# Patient Record
Sex: Male | Born: 1965 | Race: White | Hispanic: No | Marital: Married | Smoking: Never smoker
Health system: Southern US, Community
[De-identification: ages and names within clinical notes are randomized; demographics above are authoritative.]

## PROBLEM LIST (undated history)

## (undated) DIAGNOSIS — R143 Flatulence: Secondary | ICD-10-CM

## (undated) DIAGNOSIS — K5900 Constipation, unspecified: Secondary | ICD-10-CM

## (undated) DIAGNOSIS — E790 Hyperuricemia without signs of inflammatory arthritis and tophaceous disease: Secondary | ICD-10-CM

## (undated) DIAGNOSIS — K921 Melena: Secondary | ICD-10-CM

## (undated) DIAGNOSIS — N200 Calculus of kidney: Secondary | ICD-10-CM

## (undated) HISTORY — DX: Constipation, unspecified: K59.00

## (undated) HISTORY — DX: Melena: K92.1

## (undated) HISTORY — DX: Flatulence: R14.3

## (undated) HISTORY — DX: Calculus of kidney: N20.0

## (undated) HISTORY — PX: OTHER SURGICAL HISTORY: SHX169

## (undated) HISTORY — PX: NO PAST SURGERIES: SHX2092

---
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
## 2004-03-06 ENCOUNTER — Encounter: Admission: RE | Admit: 2004-03-06 | Discharge: 2004-03-06 | Payer: Self-pay | Admitting: Orthopaedic Surgery

## 2004-04-01 ENCOUNTER — Encounter: Admission: RE | Admit: 2004-04-01 | Discharge: 2004-04-01 | Payer: Self-pay | Admitting: Orthopaedic Surgery

## 2008-02-05 ENCOUNTER — Emergency Department (HOSPITAL_COMMUNITY): Admission: EM | Admit: 2008-02-05 | Discharge: 2008-02-05 | Payer: Self-pay | Admitting: Emergency Medicine

## 2010-05-21 IMAGING — CT CT ABDOMEN W/O CM
1 series · 15 of 32 positions shown, 19 images · non-contrast
Comparison: None available

CT ABDOMEN

CLINICAL DATA: Left flank pain

CT ABDOMEN AND PELVIS WITHOUT CONTRAST
TECHNIQUE: Multidetector CT imaging of the abdomen and pelvis was
performed following the standard protocol without intravenous
contrast.

[Series 2: stone over (id) 5.0 b40f · axial · 0.89mm/px · z∈[-502,-37]mm · 15 of 104 slices shown, 19 images]
[im 7/104  soft-tissue]
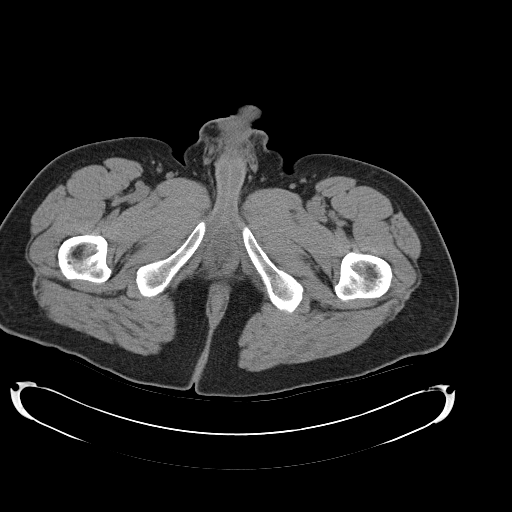
[im 7/104  bone]
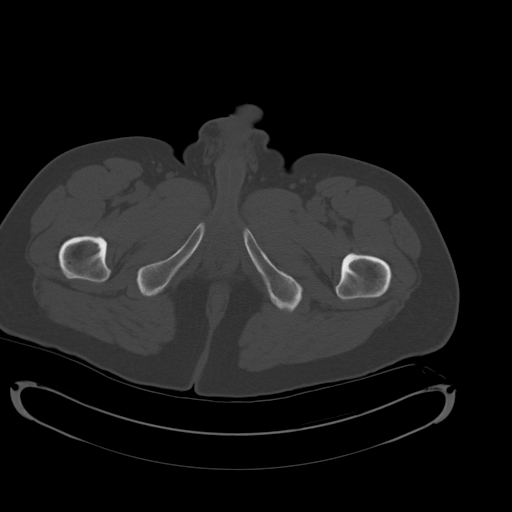
[im 14/104  soft-tissue]
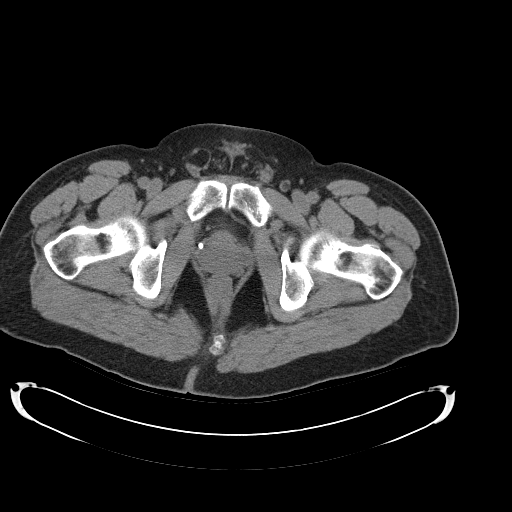
[im 20/104  soft-tissue]
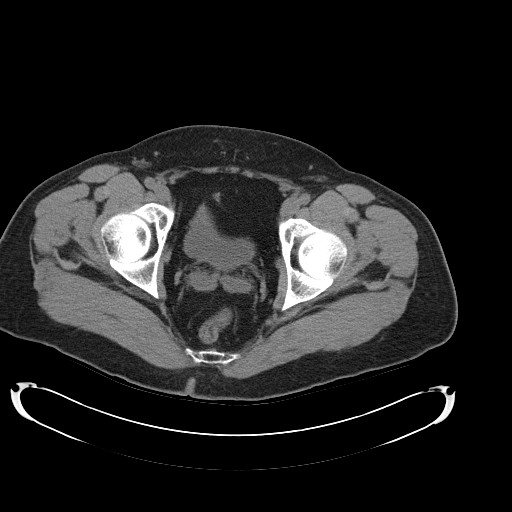
[im 30/104  soft-tissue]
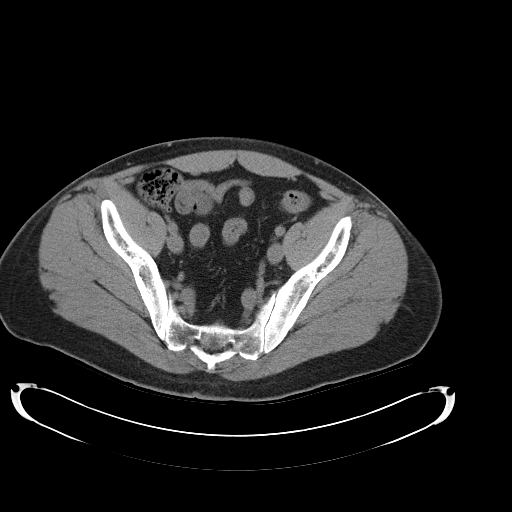
[im 37/104  soft-tissue]
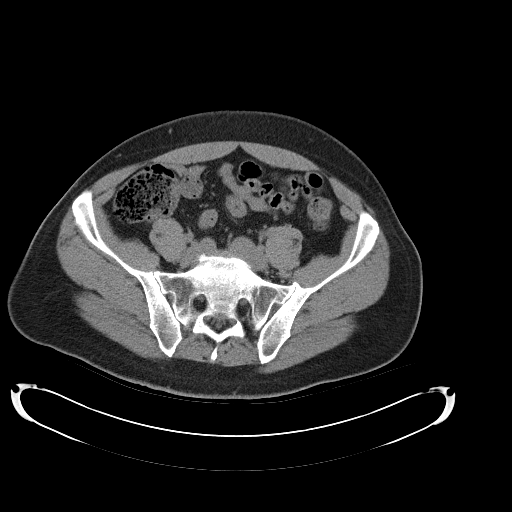
[im 44/104  soft-tissue]
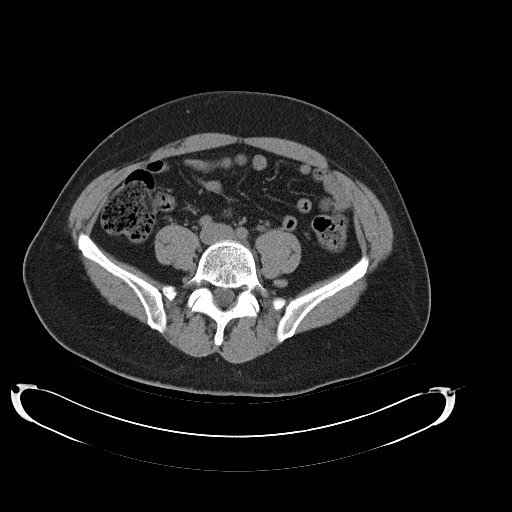
[im 54/104  soft-tissue]
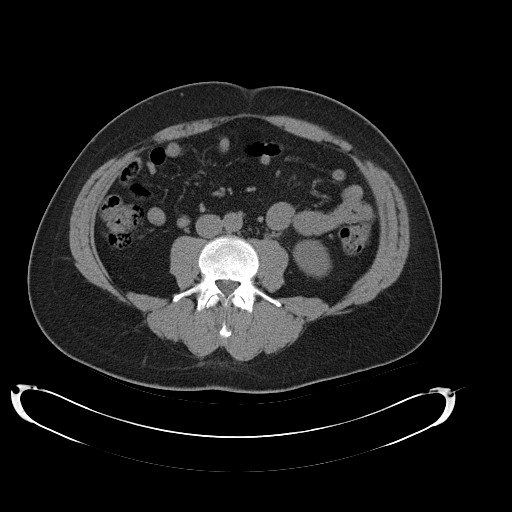
[im 60/104  soft-tissue]
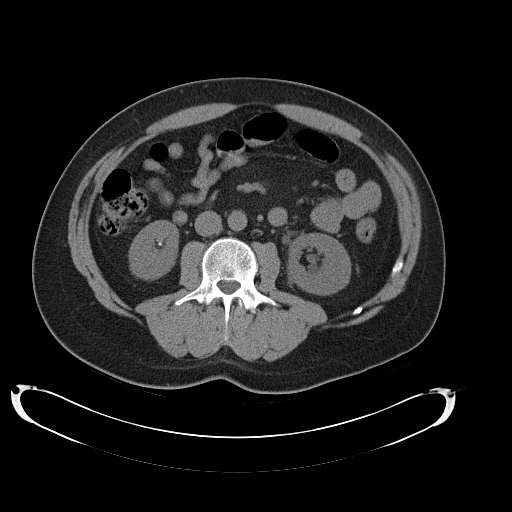
[im 67/104  soft-tissue]
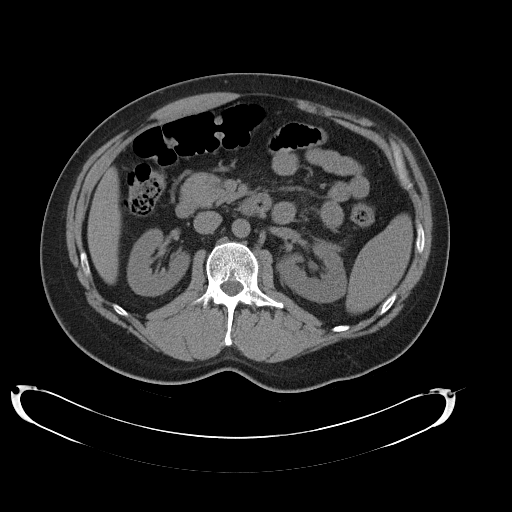
[im 67/104  bone]
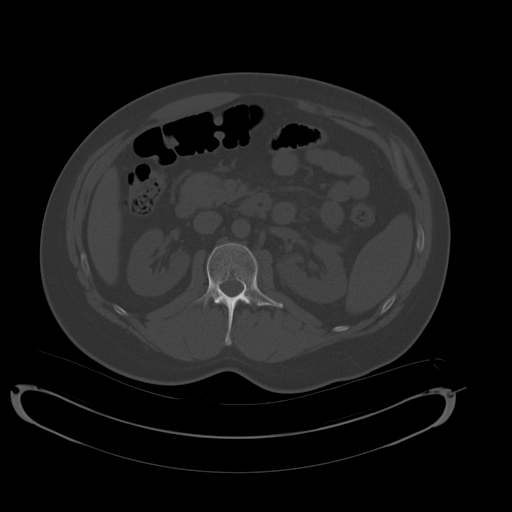
[im 74/104  soft-tissue]
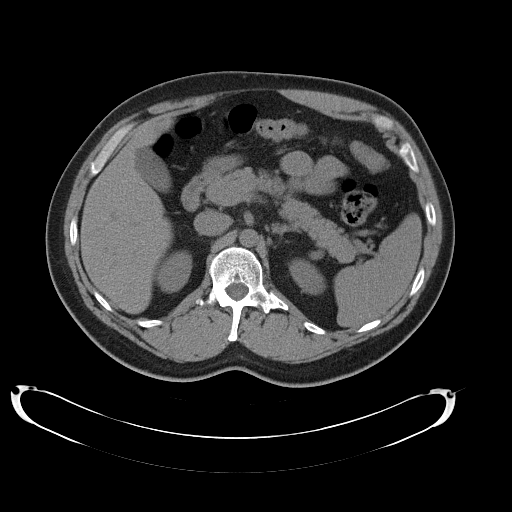
[im 84/104  soft-tissue]
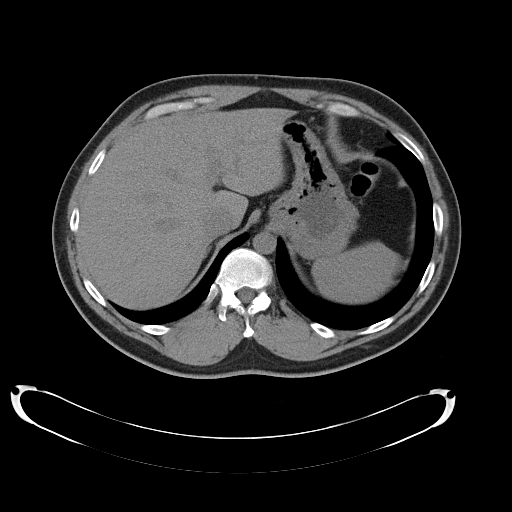
[im 90/104  soft-tissue]
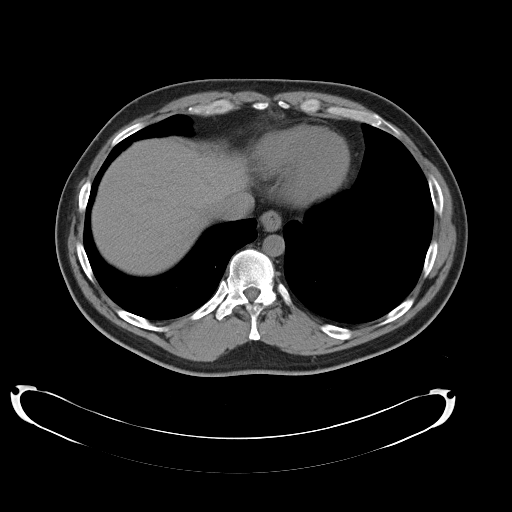
[im 90/104  lung]
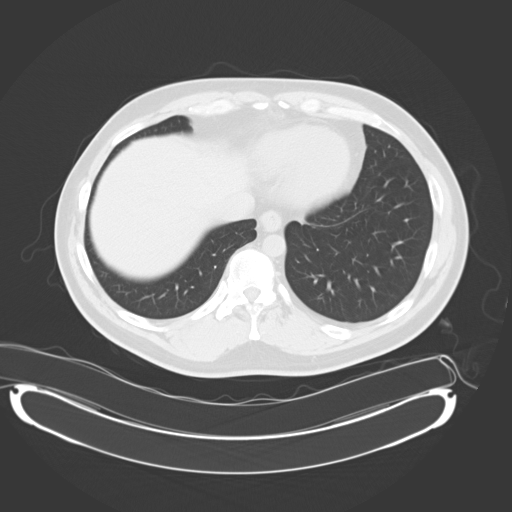
[im 94/104  lung]
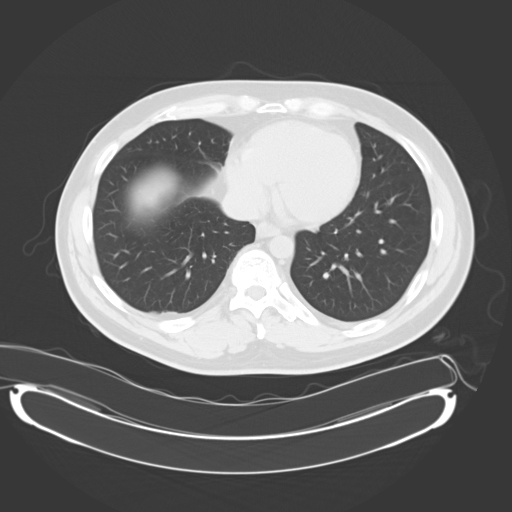
[im 97/104  soft-tissue]
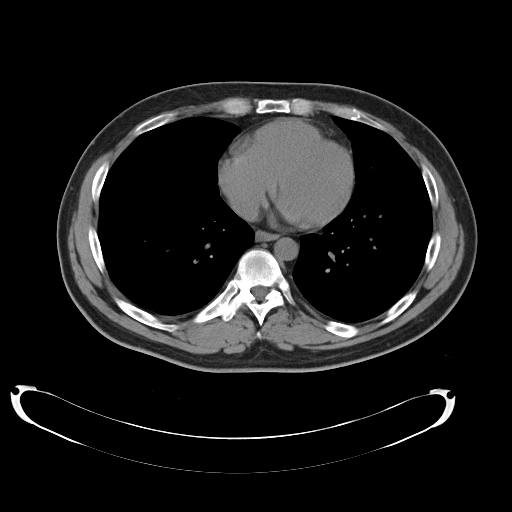
[im 97/104  lung]
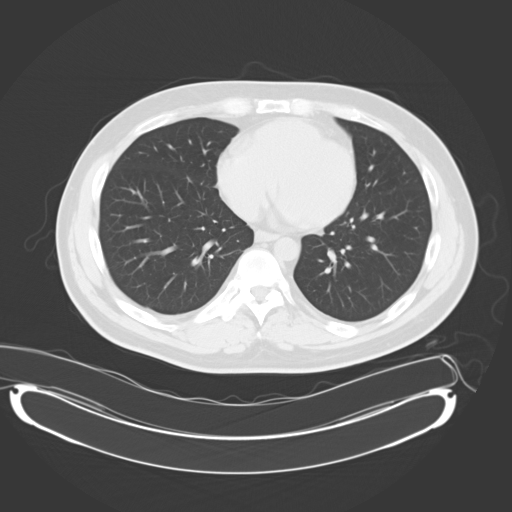
[im 100/104  lung]
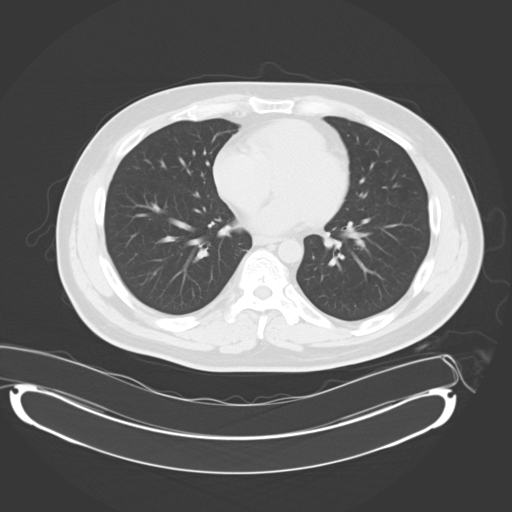

[15 of 32 positions shown; findings below may reference images not displayed]

FINDINGS: Visualized lung bases clear.  Unremarkable noncontrast
evaluation of liver, gallbladder, spleen, adrenal glands, pancreas,
abdominal aorta, small bowel.  There is a 2 mm calculus in the
lower pole right renal collecting system without hydronephrosis.
There is mild left hydronephrosis and proximal   ureterectasis
without nephrolithiasis.  Mild inflammatory/edematous changes are
noted around the left renal collecting system.  No free air.  No
ascites.  No adenopathy.  Degenerative changes noted at the L5-S1
interspace.
IMPRESSION: 1.  Left mild hydronephrosis and proximal ureterectasis.
2.  Right nephrolithiasis

CT PELVIS
FINDINGS: The left ureterectasis extends down to the level of 1 mm
calculus   just proximal to the ureteral orifice.  The urinary
bladder is incompletely distended.  Distal right ureter
decompressed, unremarkable.  Normal appendix.  The colon is
nondilated.  No free fluid.  Right pelvic phlebolith.
IMPRESSION: 1 mm obstructing distal left ureteral calculus

## 2010-05-27 ENCOUNTER — Other Ambulatory Visit: Payer: Self-pay | Admitting: Orthopaedic Surgery

## 2010-05-27 DIAGNOSIS — M545 Low back pain, unspecified: Secondary | ICD-10-CM

## 2010-05-27 DIAGNOSIS — M5440 Lumbago with sciatica, unspecified side: Secondary | ICD-10-CM

## 2010-05-29 ENCOUNTER — Ambulatory Visit
Admission: RE | Admit: 2010-05-29 | Discharge: 2010-05-29 | Disposition: A | Discharge status: Home / Self Care | Payer: Managed Care, Other (non HMO) | Source: Ambulatory Visit | Attending: Orthopaedic Surgery | Admitting: Orthopaedic Surgery

## 2010-05-29 DIAGNOSIS — M545 Low back pain, unspecified: Secondary | ICD-10-CM

## 2010-05-29 DIAGNOSIS — M5440 Lumbago with sciatica, unspecified side: Secondary | ICD-10-CM

## 2010-11-11 ENCOUNTER — Encounter (INDEPENDENT_AMBULATORY_CARE_PROVIDER_SITE_OTHER): Payer: Self-pay

## 2010-11-27 ENCOUNTER — Ambulatory Visit (INDEPENDENT_AMBULATORY_CARE_PROVIDER_SITE_OTHER): Payer: Managed Care, Other (non HMO) | Admitting: Internal Medicine

## 2010-12-11 ENCOUNTER — Ambulatory Visit (INDEPENDENT_AMBULATORY_CARE_PROVIDER_SITE_OTHER): Payer: Managed Care, Other (non HMO) | Admitting: Internal Medicine

## 2010-12-11 ENCOUNTER — Encounter (INDEPENDENT_AMBULATORY_CARE_PROVIDER_SITE_OTHER): Payer: Self-pay | Admitting: Internal Medicine

## 2010-12-11 VITALS — BP 124/72 | HR 72 | Temp 97.8°F | Ht 72.0 in | Wt 249.4 lb

## 2010-12-11 DIAGNOSIS — K5289 Other specified noninfective gastroenteritis and colitis: Secondary | ICD-10-CM

## 2010-12-11 DIAGNOSIS — K529 Noninfective gastroenteritis and colitis, unspecified: Secondary | ICD-10-CM

## 2010-12-11 NOTE — Progress Notes (Signed)
Subjective:     Patient ID: Austin Curtis, male   DOB: 1966-01-18, 45 y.o.   MRN: [id number]  HPI Austin Curtis is a 45 yr old male referred to our office by [facility] by Gennette Pac NP.  He tells me he ate at a [place] Rest.and became sick.  This was 6 weeks ago.  He had blood in his stool x 2 days and then it cleared.  Noticed blood  on the toilet tissue.   No fever associated with symptoms. He did not go see a physician until afterwards.  He says he had a lot of diarrhea for 2 days. He had about 5-6 stools in a 24 hr period.  Stools were very loose. Since then he has been fine.  Appetite is good. No weight loss. No abdominal pain.  He is having one stool every other day. No melena or bright red rectal bleeding. His CBC and KUB were normal at the [facility].  No family hx of colon cancer.  Review of Systems see hpi No current outpatient prescriptions on file.  No meds History   Social History  . Marital Status: Married    Spouse Name: N/A    Number of Children: N/A  . Years of Education: N/A   Occupational History  . Not on file.   Social History Main Topics  . Smoking status: Never Smoker   . Smokeless tobacco: Not on file  . Alcohol Use: No  . Drug Use: No  . Sexually Active: Not on file   Other Topics Concern  . Not on file   Social History Narrative  . No narrative on file   Past Medical History  Diagnosis Date  . Blood in stool   . Abdominal pain   . Flatus    Past Surgical History  Procedure Date  . None    Allergies  Allergen Reactions  . Penicillins Anaphylaxis   Family Status  Relation Status Death Age  . Mother Alive     good health  . Father Alive     Bladder cancer  . Sister Other     One deceased MVC, Two in good health  . Brother Alive     good health  . Child Alive     good health. Married. Works [employer].        Objective:   Physical Exam Filed Vitals:   12/11/10 1416  BP: 124/72  Pulse: 72  Temp: 97.8 F (36.6 C)   Filed  Vitals:   12/11/10 1416  Height: 6' (1.829 m)  Weight: 249 lb 6.4 oz (113.127 kg)     Alert and oriented. Skin warm and dry. Oral mucosa is moist. Natural teeth in good condition. Sclera anicteric, conjunctivae is pink. Thyroid not enlarged. No cervical lymphadenopathy. Lungs clear. Heart regular rate and rhythm.  Abdomen is soft. Bowel sounds are positive. No hepatomegaly. No abdominal masses felt. No tenderness. Guaiac negative.  No edema to lower extremities. Patient is alert and oriented.      Assessment:    Rectal bleeding which has now resolved.  No rectal bleeding in 6 weeks.  This was probably a gastroenteritis after eating [place] food..  No symptoms now. Stool guaiac negative.   Plan:    I discussed this case with Dr. Karilyn Cota. Patient may wait till he is 27 for his screening colonoscopy.  I did talk with patient and told him if he wanted to proceed with a colonoscopy, we would. Patient would like  to wait till age 59 unless he has problems.

## 2011-01-06 LAB — URINALYSIS, ROUTINE W REFLEX MICROSCOPIC
Bilirubin Urine: NEGATIVE
Glucose, UA: NEGATIVE
Ketones, ur: NEGATIVE
Leukocytes, UA: NEGATIVE
Nitrite: NEGATIVE
Protein, ur: NEGATIVE
Specific Gravity, Urine: 1.025
Urobilinogen, UA: 0.2
pH: 5.5

## 2011-01-06 LAB — URINE MICROSCOPIC-ADD ON

## 2013-04-21 ENCOUNTER — Encounter (INDEPENDENT_AMBULATORY_CARE_PROVIDER_SITE_OTHER): Payer: Self-pay

## 2013-04-21 ENCOUNTER — Ambulatory Visit (INDEPENDENT_AMBULATORY_CARE_PROVIDER_SITE_OTHER): Payer: 59

## 2013-04-21 ENCOUNTER — Ambulatory Visit (INDEPENDENT_AMBULATORY_CARE_PROVIDER_SITE_OTHER): Payer: 59 | Admitting: Family Medicine

## 2013-04-21 VITALS — BP 146/95 | HR 112 | Temp 97.6°F | Ht 72.0 in | Wt 275.4 lb

## 2013-04-21 DIAGNOSIS — N23 Unspecified renal colic: Secondary | ICD-10-CM

## 2013-04-21 DIAGNOSIS — N200 Calculus of kidney: Secondary | ICD-10-CM | POA: Insufficient documentation

## 2013-04-21 DIAGNOSIS — Z6836 Body mass index (BMI) 36.0-36.9, adult: Secondary | ICD-10-CM | POA: Insufficient documentation

## 2013-04-21 DIAGNOSIS — E669 Obesity, unspecified: Secondary | ICD-10-CM | POA: Insufficient documentation

## 2013-04-21 DIAGNOSIS — Z6837 Body mass index (BMI) 37.0-37.9, adult: Secondary | ICD-10-CM

## 2013-04-21 LAB — POCT URINALYSIS DIPSTICK
Bilirubin, UA: NEGATIVE
Glucose, UA: NEGATIVE
Ketones, UA: NEGATIVE
Leukocytes, UA: NEGATIVE
Nitrite, UA: NEGATIVE
Protein, UA: NEGATIVE
Spec Grav, UA: 1.015
Urobilinogen, UA: NEGATIVE
pH, UA: 6

## 2013-04-21 LAB — POCT UA - MICROSCOPIC ONLY
Bacteria, U Microscopic: NEGATIVE
Casts, Ur, LPF, POC: NEGATIVE
Crystals, Ur, HPF, POC: NEGATIVE
Mucus, UA: NEGATIVE
WBC, Ur, HPF, POC: NEGATIVE
Yeast, UA: NEGATIVE

## 2013-04-21 LAB — POCT CBC
Granulocyte percent: 81.8 %G — AB (ref 37–80)
HCT, POC: 47.4 % (ref 43.5–53.7)
Hemoglobin: 15.7 g/dL (ref 14.1–18.1)
Lymph, poc: 1.6 (ref 0.6–3.4)
MCH, POC: 29.2 pg (ref 27–31.2)
MCHC: 33.1 g/dL (ref 31.8–35.4)
MCV: 88.3 fL (ref 80–97)
MPV: 9.6 fL (ref 0–99.8)
POC Granulocyte: 9.3 — AB (ref 2–6.9)
POC LYMPH PERCENT: 13.6 %L (ref 10–50)
Platelet Count, POC: 198 10*3/uL (ref 142–424)
RBC: 5.4 M/uL (ref 4.69–6.13)
RDW, POC: 13 %
WBC: 11.4 10*3/uL — AB (ref 4.6–10.2)

## 2013-04-21 MED ORDER — HYDROCODONE-ACETAMINOPHEN 5-300 MG PO TABS: 1.0000 | ORAL_TABLET | Freq: Four times a day (QID) | ORAL | Status: DC | PRN

## 2013-04-21 MED ORDER — TAMSULOSIN HCL 0.4 MG PO CAPS: 0.4000 mg | ORAL_CAPSULE | Freq: Every day | ORAL | Status: DC

## 2013-04-21 MED ORDER — SULFAMETHOXAZOLE-TMP DS 800-160 MG PO TABS: 1.0000 | ORAL_TABLET | Freq: Two times a day (BID) | ORAL | Status: DC

## 2013-04-21 NOTE — Patient Instructions (Signed)
Kidney Stones Kidney stones (urolithiasis) are deposits that form inside your kidneys. The intense pain is caused by the stone moving through the urinary tract. When the stone moves, the ureter goes into spasm around the stone. The stone is usually passed in the urine.  CAUSES   A disorder that makes certain neck glands produce too much parathyroid hormone (primary hyperparathyroidism).  A buildup of uric acid crystals, similar to gout in your joints.  Narrowing (stricture) of the ureter.  A kidney obstruction present at birth (congenital obstruction).  Previous surgery on the kidney or ureters.  Numerous kidney infections. SYMPTOMS   Feeling sick to your stomach (nauseous).  Throwing up (vomiting).  Blood in the urine (hematuria).  Pain that usually spreads (radiates) to the groin.  Frequency or urgency of urination. DIAGNOSIS   Taking a history and physical exam.  Blood or urine tests.  CT scan.  Occasionally, an examination of the inside of the urinary bladder (cystoscopy) is performed. TREATMENT   Observation.  Increasing your fluid intake.  Extracorporeal shock wave lithotripsy This is a noninvasive procedure that uses shock waves to break up kidney stones.  Surgery may be needed if you have severe pain or persistent obstruction. There are various surgical procedures. Most of the procedures are performed with the use of small instruments. Only small incisions are needed to accommodate these instruments, so recovery time is minimized. The size, location, and chemical composition are all important variables that will determine the proper choice of action for you. Talk to your health care provider to better understand your situation so that you will minimize the risk of injury to yourself and your kidney.  HOME CARE INSTRUCTIONS   Drink enough water and fluids to keep your urine clear or pale yellow. This will help you to pass the stone or stone fragments.  Strain  all urine through the provided strainer. Keep all particulate matter and stones for your health care provider to see. The stone causing the pain may be as small as a grain of salt. It is very important to use the strainer each and every time you pass your urine. The collection of your stone will allow your health care provider to analyze it and verify that a stone has actually passed. The stone analysis will often identify what you can do to reduce the incidence of recurrences.  Only take over-the-counter or prescription medicines for pain, discomfort, or fever as directed by your health care provider.  Make a follow-up appointment with your health care provider as directed.  Get follow-up X-rays if required. The absence of pain does not always mean that the stone has passed. It may have only stopped moving. If the urine remains completely obstructed, it can cause loss of kidney function or even complete destruction of the kidney. It is your responsibility to make sure X-rays and follow-ups are completed. Ultrasounds of the kidney can show blockages and the status of the kidney. Ultrasounds are not associated with any radiation and can be performed easily in a matter of minutes. SEEK MEDICAL CARE IF:  You experience pain that is progressive and unresponsive to any pain medicine you have been prescribed. SEEK IMMEDIATE MEDICAL CARE IF:   Pain cannot be controlled with the prescribed medicine.  You have a fever or shaking chills.  The severity or intensity of pain increases over 18 hours and is not relieved by pain medicine.  You develop a new onset of abdominal pain.  You feel faint or pass   out.  You are unable to urinate. MAKE SURE YOU:   Understand these instructions.  Will watch your condition.  Will get help right away if you are not doing well or get worse. Document Released: 03/23/2005 Document Revised: 11/23/2012 Document Reviewed: 08/24/2012 [facility] Patient Information 2014  [place], [place].         Dr Woodroe Mode Recommendations  For nutrition information, I recommend books:  1).Eat to Live by Dr Monico Hoar. 2).Prevent and Reverse Heart Disease by Dr Suzzette Righter. 3) Dr Katherina Right Book:  Program to Reverse Diabetes  Exercise recommendations are:  If unable to walk, then the patient can exercise in a chair 3 times a day. By flapping arms like a bird gently and raising legs outwards to the front.  If ambulatory, the patient can go for walks for 30 minutes 3 times a week. Then increase the intensity and duration as tolerated.  Goal is to try to attain exercise frequency to 5 times a week.  If applicable: Best to perform resistance exercises (machines or weights) 2 days a week and cardio type exercises 3 days per week.   Obesity Obesity is defined as having too much total body fat and a body mass index (BMI) of 30 or more. BMI is an estimate of body fat and is calculated from your height and weight. Obesity happens when you consume more calories than you can burn by exercising or performing daily physical tasks. Prolonged obesity can cause major illnesses or emergencies, such as:   A stroke.  Heart disease.  Diabetes.  Cancer.  Arthritis.  High blood pressure (hypertension).  High cholesterol.  Sleep apnea.  Erectile dysfunction.  Infertility problems. CAUSES   Regularly eating unhealthy foods.  Physical inactivity.  Certain disorders, such as an underactive thyroid (hypothyroidism), Cushing's syndrome, and polycystic ovarian syndrome.  Certain medicines, such as steroids, some depression medicines, and antipsychotics.  Genetics.  Lack of sleep. DIAGNOSIS  A caregiver can diagnose obesity after calculating your BMI. Obesity will be diagnosed if your BMI is 30 or higher.  There are other methods of measuring obesity levels. Some other methods include measuring your skin fold thickness, your waist circumference, and  comparing your hip circumference to your waist circumference. TREATMENT  A healthy treatment program includes some or all of the following:  Long-term dietary changes.  Exercise and physical activity.  Behavioral and lifestyle changes.  Medicine only under the supervision of your caregiver. Medicines may help, but only if they are used with diet and exercise programs. An unhealthy treatment program includes:  Fasting.  Fad diets.  Supplements and drugs. These choices do not succeed in long-term weight control.  HOME CARE INSTRUCTIONS   Exercise and perform physical activity as directed by your caregiver. To increase physical activity, try the following:  Use stairs instead of elevators.  Park farther away from store entrances.  Garden, bike, or walk instead of watching television or using the computer.  Eat healthy, low-calorie foods and drinks on a regular basis. Eat more fruits and vegetables. Use low-calorie cookbooks or take healthy cooking classes.  Limit fast food, sweets, and processed snack foods.  Eat smaller portions.  Keep a daily journal of everything you eat. There are many free websites to help you with this. It may be helpful to measure your foods so you can determine if you are eating the correct portion sizes.  Avoid drinking alcohol. Drink more water and drinks without calories.  Take vitamins and supplements only as  recommended by your caregiver.  Weight-loss support groups, Optometristegistered Dieticians, counselors, and stress reduction education can also be very helpful. SEEK IMMEDIATE MEDICAL CARE IF:  You have chest pain or tightness.  You have trouble breathing or feel short of breath.  You have weakness or leg numbness.  You feel confused or have trouble talking.  You have sudden changes in your vision. MAKE SURE YOU:  Understand these instructions.  Will watch your condition.  Will get help right away if you are not doing well or get  worse. Document Released: 04/30/2004 Document Revised: 09/22/2011 Document Reviewed: 04/29/2011 [facility] Patient Information 2014 [employer].

## 2013-04-21 NOTE — Progress Notes (Signed)
Patient ID: Austin Curtis, male   DOB: [birth year], 48 y.o.   MRN: [id number] SUBJECTIVE: CC: Chief Complaint  Patient presents with  . lower abdominal and lower right side pain    ?kidney stone    HPI: 6 pm yesterday RLQ pain and moved to the right lower back. Then it moved back. Has h/o kidney stones. Last attack years ago. Was in pain at 6 am . No pain now. Never had stone analysis. No nausea , no vomiting.  Past Medical History  Diagnosis Date  . Blood in stool   . Abdominal pain   . Flatus   . Kidney stones    Past Surgical History  Procedure Laterality Date  . None     History   Social History  . Marital Status: Married    Spouse Name: N/A    Number of Children: N/A  . Years of Education: N/A   Occupational History  . Not on file.   Social History Main Topics  . Smoking status: Never Smoker   . Smokeless tobacco: Not on file  . Alcohol Use: No  . Drug Use: No  . Sexual Activity: Not on file   Other Topics Concern  . Not on file   Social History Narrative  . No narrative on file   History reviewed. No pertinent family history. No current outpatient prescriptions on file prior to visit.   No current facility-administered medications on file prior to visit.   Allergies  Allergen Reactions  . Penicillins Anaphylaxis    There is no immunization history on file for this patient. Prior to Admission medications   Not on File     ROS: As above in the HPI. All other systems are stable or negative.  OBJECTIVE: APPEARANCE:  Patient in no acute distress.The patient appeared well nourished and normally developed. Acyanotic. Waist: VITAL SIGNS:BP 146/95  Pulse 112  Temp(Src) 97.6 F (36.4 C) (Oral)  Ht 6' (1.829 m)  Wt 275 lb 6.4 oz (124.921 kg)  BMI 37.34 kg/m2 OBese WM  SKIN: warm and  Dry without overt rashes, tattoos and scars  HEAD and Neck: without JVD, Head and scalp: normal Eyes:No scleral icterus. Fundi normal, eye movements  normal. Ears: Auricle normal, canal normal, Tympanic membranes normal, insufflation normal. Nose: normal Throat: normal Neck & thyroid: normal  CHEST & LUNGS: Chest wall: normal Lungs: Clear  CVS: Reveals the PMI to be normally located. Regular rhythm, First and Second Heart sounds are normal,  absence of murmurs, rubs or gallops. Peripheral vasculature: Radial pulses: normal Dorsal pedis pulses: normal Posterior pulses: normal  ABDOMEN:  Appearance: Obese Benign, no organomegaly, no masses, no Abdominal Aortic enlargement. No Guarding , no rebound. No Bruits. Bowel sounds: normal  RECTAL: N/A GU: N/A  EXTREMETIES: nonedematous.  MUSCULOSKELETAL:  Spine: normal Joints: intact  NEUROLOGIC: oriented to time,place and person; nonfocal. Strength is normal Sensory is normal Reflexes are normal Cranial Nerves are normal.  ASSESSMENT:  Renal colic on right side - Plan: POCT CBC, CMP14+EGFR, Uric acid, DG Abd 1 View, POCT UA - Microscopic Only, POCT urinalysis dipstick, Urine culture  Obesity  Kidney stones - Plan: tamsulosin (FLOMAX) 0.4 MG CAPS capsule, sulfamethoxazole-trimethoprim (BACTRIM DS) 800-160 MG per tablet, Hydrocodone-Acetaminophen 5-300 MG TABS  PLAN: Handout on kidney stones in the AVS. Discussed treatment plan  Orders Placed This Encounter  Procedures  . Urine culture  . DG Abd 1 View    Standing Status: Future     Number of  Occurrences: 1     Standing Expiration Date: 06/21/2014    Order Specific Question:  Reason for Exam (SYMPTOM  OR DIAGNOSIS REQUIRED)    Answer:  RLQ colicky pain, h/o kidney stones    Order Specific Question:  Preferred imaging location?    Answer:  Internal  . CMP14+EGFR  . Uric acid  . POCT CBC  . POCT UA - Microscopic Only  . POCT urinalysis dipstick  [facility] reading (PRIMARY) by  Dr. Jacelyn Grip: small stone 89m in the right pelvic cavity/ in the bladder?? Await official reading                                Results for  orders placed in visit on 04/21/13  POCT CBC      Result Value Range   WBC 11.4 (*) 4.6 - 10.2 K/uL   Lymph, poc 1.6  0.6 - 3.4   POC LYMPH PERCENT 13.6  10 - 50 %L   MID (cbc)    0 - 0.9   POC MID %    0 - 12 %M   POC Granulocyte 9.3 (*) 2 - 6.9   Granulocyte percent 81.8 (*) 37 - 80 %G   RBC 5.4  4.69 - 6.13 M/uL   Hemoglobin 15.7  14.1 - 18.1 g/dL   HCT, POC 47.4  43.5 - 53.7 %   MCV 88.3  80 - 97 fL   MCH, POC 29.2  27 - 31.2 pg   MCHC 33.1  31.8 - 35.4 g/dL   RDW, POC 13.0     Platelet Count, POC 198.0  142 - 424 K/uL   MPV 9.6  0 - 99.8 fL  POCT UA - MICROSCOPIC ONLY      Result Value Range   WBC, Ur, HPF, POC neg     RBC, urine, microscopic 5-10     Bacteria, U Microscopic neg     Mucus, UA neg     Epithelial cells, urine per micros occ     Crystals, Ur, HPF, POC neg     Casts, Ur, LPF, POC neg     Yeast, UA neg    POCT URINALYSIS DIPSTICK      Result Value Range   Color, UA yellow     Clarity, UA clear     Glucose, UA neg     Bilirubin, UA neg     Ketones, UA neg     Spec Grav, UA 1.015     Blood, UA mod     pH, UA 6.0     Protein, UA neg     Urobilinogen, UA negative     Nitrite, UA neg     Leukocytes, UA Negative      Meds ordered this encounter  Medications  . tamsulosin (FLOMAX) 0.4 MG CAPS capsule    Sig: Take 1 capsule (0.4 mg total) by mouth daily.    Dispense:  30 capsule    Refill:  3  . sulfamethoxazole-trimethoprim (BACTRIM DS) 800-160 MG per tablet    Sig: Take 1 tablet by mouth 2 (two) times daily.    Dispense:  20 tablet    Refill:  0  . Hydrocodone-Acetaminophen 5-300 MG TABS    Sig: Take 1 tablet by mouth every 6 (six) hours as needed.    Dispense:  20 each    Refill:  0       Dr FPaula Libra  Recommendations  For nutrition information, I recommend books:  1).Eat to Live by Dr Excell Seltzer. 2).Prevent and Reverse Heart Disease by Dr Karl Luke. 3) Dr Janene Harvey Book:  Program to Reverse Diabetes  Exercise  recommendations are:  If unable to walk, then the patient can exercise in a chair 3 times a day. By flapping arms like a bird gently and raising legs outwards to the front.  If ambulatory, the patient can go for walks for 30 minutes 3 times a week. Then increase the intensity and duration as tolerated.  Goal is to try to attain exercise frequency to 5 times a week.  If applicable: Best to perform resistance exercises (machines or weights) 2 days a week and cardio type exercises 3 days per week.  There are no discontinued medications. Return in about 1 week (around 04/28/2013).  Florabelle Cardin P. Jacelyn Grip, M.D.

## 2013-04-22 LAB — CMP14+EGFR
ALT: 42 IU/L (ref 0–44)
AST: 25 IU/L (ref 0–40)
Albumin/Globulin Ratio: 1.8 (ref 1.1–2.5)
Albumin: 4.6 g/dL (ref 3.5–5.5)
Alkaline Phosphatase: 123 IU/L — ABNORMAL HIGH (ref 39–117)
BUN/Creatinine Ratio: 10 (ref 9–20)
BUN: 13 mg/dL (ref 6–24)
CO2: 24 mmol/L (ref 18–29)
Calcium: 9.9 mg/dL (ref 8.7–10.2)
Chloride: 102 mmol/L (ref 97–108)
Creatinine, Ser: 1.27 mg/dL (ref 0.76–1.27)
GFR calc Af Amer: 76 mL/min/{1.73_m2} (ref >59)
GFR calc non Af Amer: 66 mL/min/{1.73_m2} (ref >59)
Globulin, Total: 2.6 g/dL (ref 1.5–4.5)
Glucose: 104 mg/dL — ABNORMAL HIGH (ref 65–99)
Potassium: 4.5 mmol/L (ref 3.5–5.2)
Sodium: 141 mmol/L (ref 134–144)
Total Bilirubin: 0.5 mg/dL (ref 0.0–1.2)
Total Protein: 7.2 g/dL (ref 6.0–8.5)

## 2013-04-22 LAB — URIC ACID: Uric Acid: 9.5 mg/dL — ABNORMAL HIGH (ref 3.7–8.6)

## 2013-04-23 ENCOUNTER — Encounter: Payer: Self-pay | Admitting: Family Medicine

## 2013-04-23 DIAGNOSIS — E790 Hyperuricemia without signs of inflammatory arthritis and tophaceous disease: Secondary | ICD-10-CM | POA: Insufficient documentation

## 2013-04-23 HISTORY — DX: Hyperuricemia without signs of inflammatory arthritis and tophaceous disease: E79.0

## 2013-04-23 LAB — URINE CULTURE: Organism ID, Bacteria: NO GROWTH

## 2013-04-23 NOTE — Progress Notes (Signed)
Quick Note:  Call Patient Labs that are abnormal: Uric Acid is high We know there was blood in the urine and the WBC was elevated at the visit. Suspect that he may have a urate stone.  The rest are at goal  Recommendations: Will need to lower the uric acid with a low purine diet. Will discuss at his follow up this week.   ______

## 2013-04-28 ENCOUNTER — Ambulatory Visit: Payer: 59 | Admitting: Family Medicine

## 2013-08-21 ENCOUNTER — Ambulatory Visit (INDEPENDENT_AMBULATORY_CARE_PROVIDER_SITE_OTHER): Payer: 59 | Admitting: Nurse Practitioner

## 2013-08-21 VITALS — BP 137/89 | HR 88 | Temp 97.9°F | Ht 72.0 in | Wt 274.4 lb

## 2013-08-21 DIAGNOSIS — J309 Allergic rhinitis, unspecified: Secondary | ICD-10-CM

## 2013-08-21 MED ORDER — CETIRIZINE HCL 10 MG PO TABS: 10.0000 mg | ORAL_TABLET | Freq: Every day | ORAL | Status: DC

## 2013-08-21 MED ORDER — FLUTICASONE PROPIONATE 50 MCG/ACT NA SUSP: 2.0000 | Freq: Every day | NASAL | Status: DC

## 2013-08-21 NOTE — Progress Notes (Signed)
   Subjective:    Patient ID: Austin Curtis, male    DOB: 1965-04-26, 48 y.o.   MRN: [id number]  HPI Patient in c/o congestion and cough that started about 1 week.     Review of Systems  Constitutional: Negative.  Negative for fever and chills.  HENT: Positive for congestion and postnasal drip. Negative for sinus pressure.   Respiratory: Negative for cough.   Cardiovascular: Negative.   Gastrointestinal: Negative.   Psychiatric/Behavioral: Negative.   All other systems reviewed and are negative.      Objective:   Physical Exam  Constitutional: He is oriented to person, place, and time. He appears well-developed and well-nourished.  HENT:  Right Ear: Hearing, tympanic membrane, external ear and ear canal normal.  Left Ear: Hearing, tympanic membrane, external ear and ear canal normal.  Nose: Mucosal edema and rhinorrhea present. Right sinus exhibits no maxillary sinus tenderness and no frontal sinus tenderness. Left sinus exhibits no maxillary sinus tenderness and no frontal sinus tenderness.  Mouth/Throat: Uvula is midline, oropharynx is clear and moist and mucous membranes are normal.  Eyes: Pupils are equal, round, and reactive to light.  Neck: Normal range of motion. Neck supple.  Cardiovascular: Normal rate, regular rhythm and normal heart sounds.   Pulmonary/Chest: Effort normal and breath sounds normal.  Neurological: He is alert and oriented to person, place, and time.  Skin: Skin is warm and dry.  Psychiatric: He has a normal mood and affect. His behavior is normal. Judgment and thought content normal.   BP 137/89  Pulse 88  Temp(Src) 97.9 F (36.6 C) (Oral)  Ht 6' (1.829 m)  Wt 274 lb 6.4 oz (124.467 kg)  BMI 37.21 kg/m2        Assessment & Plan:  1. Allergic rhinitis Avoid allergens Wear mask when doing yard work RTO prn - cetirizine (ZYRTEC) 10 MG tablet; Take 1 tablet (10 mg total) by mouth daily.  Dispense: 30 tablet; Refill: 11 - fluticasone  (FLONASE) 50 MCG/ACT nasal spray; Place 2 sprays into both nostrils daily.  Dispense: 16 g; Refill: 6  Mary-Margaret Daphine DeutscherMartin, FNP

## 2013-08-21 NOTE — Patient Instructions (Signed)
Allergic Rhinitis Allergic rhinitis is when the mucous membranes in the nose respond to allergens. Allergens are particles in the air that cause your body to have an allergic reaction. This causes you to release allergic antibodies. Through a chain of events, these eventually cause you to release histamine into the blood stream. Although meant to protect the body, it is this release of histamine that causes your discomfort, such as frequent sneezing, congestion, and an itchy, runny nose.  CAUSES  Seasonal allergic rhinitis (hay fever) is caused by pollen allergens that may come from grasses, trees, and weeds. Year-round allergic rhinitis (perennial allergic rhinitis) is caused by allergens such as house dust mites, pet dander, and mold spores.  SYMPTOMS   Nasal stuffiness (congestion).  Itchy, runny nose with sneezing and tearing of the eyes. DIAGNOSIS  Your health care provider can help you determine the allergen or allergens that trigger your symptoms. If you and your health care provider are unable to determine the allergen, skin or blood testing may be used. TREATMENT  Allergic Rhinitis does not have a cure, but it can be controlled by:  Medicines and allergy shots (immunotherapy).  Avoiding the allergen. Hay fever may often be treated with antihistamines in pill or nasal spray forms. Antihistamines block the effects of histamine. There are over-the-counter medicines that may help with nasal congestion and swelling around the eyes. Check with your health care provider before taking or giving this medicine.  If avoiding the allergen or the medicine prescribed do not work, there are many new medicines your health care provider can prescribe. Stronger medicine may be used if initial measures are ineffective. Desensitizing injections can be used if medicine and avoidance does not work. Desensitization is when a patient is given ongoing shots until the body becomes less sensitive to the allergen.  Make sure you follow up with your health care provider if problems continue. HOME CARE INSTRUCTIONS It is not possible to completely avoid allergens, but you can reduce your symptoms by taking steps to limit your exposure to them. It helps to know exactly what you are allergic to so that you can avoid your specific triggers. SEEK MEDICAL CARE IF:   You have a fever.  You develop a cough that does not stop easily (persistent).  You have shortness of breath.  You start wheezing.  Symptoms interfere with normal daily activities. Document Released: 12/16/2000 Document Revised: 01/11/2013 Document Reviewed: 11/28/2012 ExitCare Patient Information 2014 [employer].  

## 2014-03-16 ENCOUNTER — Encounter: Payer: Self-pay | Admitting: *Deleted

## 2014-04-24 ENCOUNTER — Encounter: Payer: 59 | Admitting: Nurse Practitioner

## 2014-05-02 ENCOUNTER — Ambulatory Visit (INDEPENDENT_AMBULATORY_CARE_PROVIDER_SITE_OTHER): Payer: 59

## 2014-05-02 ENCOUNTER — Ambulatory Visit (INDEPENDENT_AMBULATORY_CARE_PROVIDER_SITE_OTHER): Payer: 59 | Admitting: Nurse Practitioner

## 2014-05-02 ENCOUNTER — Encounter: Payer: Self-pay | Admitting: Nurse Practitioner

## 2014-05-02 VITALS — BP 138/91 | HR 83 | Temp 96.8°F | Ht 72.0 in | Wt 276.0 lb

## 2014-05-02 DIAGNOSIS — Z0000 Encounter for general adult medical examination without abnormal findings: Secondary | ICD-10-CM

## 2014-05-02 DIAGNOSIS — E785 Hyperlipidemia, unspecified: Secondary | ICD-10-CM

## 2014-05-02 DIAGNOSIS — Z6837 Body mass index (BMI) 37.0-37.9, adult: Secondary | ICD-10-CM

## 2014-05-02 DIAGNOSIS — E782 Mixed hyperlipidemia: Secondary | ICD-10-CM | POA: Insufficient documentation

## 2014-05-02 DIAGNOSIS — Z1211 Encounter for screening for malignant neoplasm of colon: Secondary | ICD-10-CM

## 2014-05-02 NOTE — Patient Instructions (Signed)
Fat and Cholesterol Control Diet Fat and cholesterol levels in your blood and organs are influenced by your diet. High levels of fat and cholesterol may lead to diseases of the heart, small and large blood vessels, gallbladder, liver, and pancreas. CONTROLLING FAT AND CHOLESTEROL WITH DIET Although exercise and lifestyle factors are important, your diet is key. That is because certain foods are known to raise cholesterol and others to lower it. The goal is to balance foods for their effect on cholesterol and more importantly, to replace saturated and trans fat with other types of fat, such as monounsaturated fat, polyunsaturated fat, and omega-3 fatty acids. On average, a person should consume no more than 15 to 17 g of saturated fat daily. Saturated and trans fats are considered "bad" fats, and they will raise LDL cholesterol. Saturated fats are primarily found in animal products such as meats, butter, and cream. However, that does not mean you need to give up all your favorite foods. Today, there are good tasting, low-fat, low-cholesterol substitutes for most of the things you like to eat. Choose low-fat or nonfat alternatives. Choose round or loin cuts of red meat. These types of cuts are lowest in fat and cholesterol. Chicken (without the skin), fish, veal, and ground turkey breast are great choices. Eliminate fatty meats, such as hot dogs and salami. Even shellfish have little or no saturated fat. Have a 3 oz (85 g) portion when you eat lean meat, poultry, or fish. Trans fats are also called "partially hydrogenated oils." They are oils that have been scientifically manipulated so that they are solid at room temperature resulting in a longer shelf life and improved taste and texture of foods in which they are added. Trans fats are found in stick margarine, some tub margarines, cookies, crackers, and baked goods.  When baking and cooking, oils are a great substitute for butter. The monounsaturated oils are  especially beneficial since it is believed they lower LDL and raise HDL. The oils you should avoid entirely are saturated tropical oils, such as coconut and palm.  Remember to eat a lot from food groups that are naturally free of saturated and trans fat, including fish, fruit, vegetables, beans, grains (barley, rice, couscous, bulgur wheat), and pasta (without cream sauces).  IDENTIFYING FOODS THAT LOWER FAT AND CHOLESTEROL  Soluble fiber may lower your cholesterol. This type of fiber is found in fruits such as apples, vegetables such as broccoli, potatoes, and carrots, legumes such as beans, peas, and lentils, and grains such as barley. Foods fortified with plant sterols (phytosterol) may also lower cholesterol. You should eat at least 2 g per day of these foods for a cholesterol lowering effect.  Read package labels to identify low-saturated fats, trans fat free, and low-fat foods at the supermarket. Select cheeses that have only 2 to 3 g saturated fat per ounce. Use a heart-healthy tub margarine that is free of trans fats or partially hydrogenated oil. When buying baked goods (cookies, crackers), avoid partially hydrogenated oils. Breads and muffins should be made from whole grains (whole-wheat or whole oat flour, instead of "flour" or "enriched flour"). Buy non-creamy canned soups with reduced salt and no added fats.  FOOD PREPARATION TECHNIQUES  Never deep-fry. If you must fry, either stir-fry, which uses very little fat, or use non-stick cooking sprays. When possible, broil, bake, or roast meats, and steam vegetables. Instead of putting butter or margarine on vegetables, use lemon and herbs, applesauce, and cinnamon (for squash and sweet potatoes). Use nonfat   yogurt, salsa, and low-fat dressings for salads.  LOW-SATURATED FAT / LOW-FAT FOOD SUBSTITUTES Meats / Saturated Fat (g)  Avoid: Steak, marbled (3 oz/85 g) / 11 g  Choose: Steak, lean (3 oz/85 g) / 4 g  Avoid: Hamburger (3 oz/85 g) / 7  g  Choose: Hamburger, lean (3 oz/85 g) / 5 g  Avoid: Ham (3 oz/85 g) / 6 g  Choose: Ham, lean cut (3 oz/85 g) / 2.4 g  Avoid: Chicken, with skin, dark meat (3 oz/85 g) / 4 g  Choose: Chicken, skin removed, dark meat (3 oz/85 g) / 2 g  Avoid: Chicken, with skin, light meat (3 oz/85 g) / 2.5 g  Choose: Chicken, skin removed, light meat (3 oz/85 g) / 1 g Dairy / Saturated Fat (g)  Avoid: Whole milk (1 cup) / 5 g  Choose: Low-fat milk, 2% (1 cup) / 3 g  Choose: Low-fat milk, 1% (1 cup) / 1.5 g  Choose: Skim milk (1 cup) / 0.3 g  Avoid: Hard cheese (1 oz/28 g) / 6 g  Choose: Skim milk cheese (1 oz/28 g) / 2 to 3 g  Avoid: Cottage cheese, 4% fat (1 cup) / 6.5 g  Choose: Low-fat cottage cheese, 1% fat (1 cup) / 1.5 g  Avoid: Ice cream (1 cup) / 9 g  Choose: Sherbet (1 cup) / 2.5 g  Choose: Nonfat frozen yogurt (1 cup) / 0.3 g  Choose: Frozen fruit bar / trace  Avoid: Whipped cream (1 tbs) / 3.5 g  Choose: Nondairy whipped topping (1 tbs) / 1 g Condiments / Saturated Fat (g)  Avoid: Mayonnaise (1 tbs) / 2 g  Choose: Low-fat mayonnaise (1 tbs) / 1 g  Avoid: Butter (1 tbs) / 7 g  Choose: Extra light margarine (1 tbs) / 1 g  Avoid: Coconut oil (1 tbs) / 11.8 g  Choose: Olive oil (1 tbs) / 1.8 g  Choose: Corn oil (1 tbs) / 1.7 g  Choose: Safflower oil (1 tbs) / 1.2 g  Choose: Sunflower oil (1 tbs) / 1.4 g  Choose: Soybean oil (1 tbs) / 2.4 g  Choose: Canola oil (1 tbs) / 1 g Document Released: 03/23/2005 Document Revised: 07/18/2012 Document Reviewed: 06/21/2013 ExitCare Patient Information 2015 [employer]. This information is not intended to replace advice given to you by your health care provider. Make sure you discuss any questions you have with your health care provider.  

## 2014-05-02 NOTE — Progress Notes (Signed)
   Subjective:    Patient ID: Austin Curtis, male    DOB: April 17, 1965, 49 y.o.   MRN: [id number]  HPI Patient in today for annual physical exam- only current medical problem is hyperuricemia that causes him to get kidney stones frequently. His last kidney stone was early last year. 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 He is doing well today without complaints.  * He had his cholesterol checked at work which showed total Choles at 226 ; LDL at 138; tri at 118 and HDL at 52   Review of Systems  Constitutional: Negative.   HENT: Negative.   Respiratory: Negative.   Cardiovascular: Negative.   Gastrointestinal: Negative.   Genitourinary: Negative.   Neurological: Negative.   Psychiatric/Behavioral: Negative.   All other systems reviewed and are negative.      Objective:   Physical Exam  Constitutional: He is oriented to person, place, and time. He appears well-developed and well-nourished.  HENT:  Head: Normocephalic.  Right Ear: External ear normal.  Left Ear: External ear normal.  Nose: Nose normal.  Mouth/Throat: Oropharynx is clear and moist.  Eyes: EOM are normal. Pupils are equal, round, and reactive to light.  Neck: Normal range of motion. Neck supple. No JVD present. No thyromegaly present.  Cardiovascular: Normal rate, regular rhythm, normal heart sounds and intact distal pulses.  Exam reveals no gallop and no friction rub.   No murmur heard. Pulmonary/Chest: Effort normal and breath sounds normal. No respiratory distress. He has no wheezes. He has no rales. He exhibits no tenderness.  Abdominal: Soft. Bowel sounds are normal. He exhibits no mass. There is no tenderness.  Genitourinary: Penis normal.  Musculoskeletal: Normal range of motion. He exhibits no edema.  Lymphadenopathy:    He has no cervical adenopathy.  Neurological: He is alert and oriented to person, place, and time. No cranial nerve deficit.  Skin: Skin  is warm and dry.  Psychiatric: He has a normal mood and affect. His behavior is normal. Judgment and thought content normal.   BP 138/91 mmHg  Pulse 83  Temp(Src) 96.8 F (36 C) (Oral)  Ht 6' (1.829 m)  Wt 276 lb (125.193 kg)  BMI 37.42 kg/m2  Chest x ray- no acute findings- Preliminary reading by Ronnald Collum, FNP  [facility]   EKG- NSR-Mary-Margaret Hassell Done, FNP       Assessment & Plan:  1. Hyperlipidemia with target LDL less than 100 Low fat diet encouraged  2. Annual physical exam - CMP14+EGFR - PSA, total and free  3. BMI 37.0-37.9, adult Discussed diet and exercise for person with BMI >25 Will recheck weight in 3-6 months  4. Encounter for screening colonoscopy - Ambulatory referral to Gastroenterology   Td today Labs pending Health maintenance reviewed Diet and exercise encouraged Continue all meds Follow up  In 6 months   [place], FNP

## 2014-05-03 ENCOUNTER — Encounter: Payer: 59 | Admitting: Nurse Practitioner

## 2014-05-03 LAB — CMP14+EGFR
ALT: 47 IU/L — ABNORMAL HIGH (ref 0–44)
AST: 29 IU/L (ref 0–40)
Albumin/Globulin Ratio: 2 (ref 1.1–2.5)
Albumin: 4.5 g/dL (ref 3.5–5.5)
Alkaline Phosphatase: 114 IU/L (ref 39–117)
BILIRUBIN TOTAL: 0.4 mg/dL (ref 0.0–1.2)
BUN/Creatinine Ratio: 14 (ref 9–20)
BUN: 14 mg/dL (ref 6–24)
CO2: 25 mmol/L (ref 18–29)
Calcium: 9.3 mg/dL (ref 8.7–10.2)
Chloride: 101 mmol/L (ref 97–108)
Creatinine, Ser: 0.98 mg/dL (ref 0.76–1.27)
GFR calc Af Amer: 103 mL/min/{1.73_m2} (ref >59)
GFR, EST NON AFRICAN AMERICAN: 90 mL/min/{1.73_m2} (ref >59)
GLOBULIN, TOTAL: 2.2 g/dL (ref 1.5–4.5)
Glucose: 87 mg/dL (ref 65–99)
POTASSIUM: 4.4 mmol/L (ref 3.5–5.2)
SODIUM: 139 mmol/L (ref 134–144)
Total Protein: 6.7 g/dL (ref 6.0–8.5)

## 2014-05-03 LAB — PSA, TOTAL AND FREE
PSA FREE: 0.19 ng/mL
PSA, Free Pct: 31.7 %
PSA: 0.6 ng/mL (ref 0.0–4.0)

## 2014-05-04 ENCOUNTER — Encounter: Payer: Self-pay | Admitting: Internal Medicine

## 2014-06-19 ENCOUNTER — Ambulatory Visit: Payer: 59 | Admitting: Nurse Practitioner

## 2014-06-19 ENCOUNTER — Encounter: Payer: Self-pay | Admitting: Family Medicine

## 2014-06-19 ENCOUNTER — Ambulatory Visit (INDEPENDENT_AMBULATORY_CARE_PROVIDER_SITE_OTHER): Payer: 59 | Admitting: Family Medicine

## 2014-06-19 VITALS — BP 130/83 | HR 100 | Temp 98.4°F | Ht 72.0 in | Wt 245.0 lb

## 2014-06-19 DIAGNOSIS — J40 Bronchitis, not specified as acute or chronic: Secondary | ICD-10-CM | POA: Diagnosis not present

## 2014-06-19 DIAGNOSIS — R05 Cough: Secondary | ICD-10-CM

## 2014-06-19 DIAGNOSIS — R0989 Other specified symptoms and signs involving the circulatory and respiratory systems: Secondary | ICD-10-CM | POA: Diagnosis not present

## 2014-06-19 DIAGNOSIS — J302 Other seasonal allergic rhinitis: Secondary | ICD-10-CM

## 2014-06-19 DIAGNOSIS — J209 Acute bronchitis, unspecified: Secondary | ICD-10-CM

## 2014-06-19 DIAGNOSIS — R059 Cough, unspecified: Secondary | ICD-10-CM

## 2014-06-19 LAB — POCT INFLUENZA A/B
Influenza A, POC: NEGATIVE
Influenza B, POC: NEGATIVE

## 2014-06-19 MED ORDER — PREDNISONE 10 MG PO TABS: ORAL_TABLET | ORAL | Status: DC

## 2014-06-19 MED ORDER — METHYLPREDNISOLONE ACETATE 80 MG/ML IJ SUSP
60.0000 mg | Freq: Once | INTRAMUSCULAR | Status: AC
  Administered 2014-06-19: 60 mg via INTRAMUSCULAR

## 2014-06-19 NOTE — Patient Instructions (Signed)
Drink plenty of fluids Keep the house as cool as possible Use a cool mist humidifier if possible Take Mucinex, blue and white in color, maximum strength, 1 twice daily with a large glass of water Use nasal saline frequently through the day You can purchase Flonase over-the-counter and 1-2 sprays each nostril at bedtime Take prednisone as directed If you start running any fever or coughing up any yellow sputum or the wheezing gets worse call us back sooner

## 2014-06-19 NOTE — Progress Notes (Signed)
Subjective:    Patient ID: Pietro CassisDanny R Klippel, male    DOB: 1965/07/16, 49 y.o.   MRN: [id number]  HPI Patient here today for cough and congestion that started about 2 days ago. This patient also has an elevated BMI and and the patient's plan of discharge and we will recommend that he see the clinical pharmacist for a weight loss regimen. The patient indicates that he has seasonal problems this time of the year and in the fall with ragweed. He has not been running any fever has not had a sore throat other than coughing and has not had any ear pain.         Patient Active Problem List   Diagnosis Date Noted  . Hyperlipidemia with target LDL less than 100 05/02/2014  . Hyperuricemia 04/23/2013  . BMI 37.0-37.9, adult 04/21/2013  . Kidney stones    No outpatient encounter prescriptions on file as of 06/19/2014.    Review of Systems  Constitutional: Negative.  Negative for fever.  HENT: Positive for congestion.   Eyes: Negative.   Respiratory: Positive for cough.   Cardiovascular: Negative.   Gastrointestinal: Negative.   Endocrine: Negative.   Genitourinary: Negative.   Musculoskeletal: Negative.   Skin: Negative.   Allergic/Immunologic: Negative.   Neurological: Negative.   Hematological: Negative.   Psychiatric/Behavioral: Negative.        Objective:   Physical Exam  Constitutional: He is oriented to person, place, and time. He appears well-developed and well-nourished. No distress.  The patient is pleasant and very nice and in no acute distress  HENT:  Head: Normocephalic and atraumatic.  Right Ear: External ear normal.  Left Ear: External ear normal.  Mouth/Throat: Oropharynx is clear and moist. No oropharyngeal exudate.  Nasal congestion bilaterally. The throat was not visualized due to his strong gag reflex  Eyes: Conjunctivae and EOM are normal. Pupils are equal, round, and reactive to light. Right eye exhibits no discharge. Left eye exhibits no discharge. No  scleral icterus.  Neck: Normal range of motion. Neck supple. No thyromegaly present.  No anterior cervical adenopathy or posterior cervical adenopathy  Cardiovascular: Normal rate, regular rhythm and normal heart sounds.   No murmur heard. Pulmonary/Chest: Effort normal and breath sounds normal. No respiratory distress. He has no wheezes. He has no rales. He exhibits no tenderness.  No axillary adenopathy. There is no wheezing and the patient had a dry cough without congestion  Abdominal: Soft. Bowel sounds are normal. He exhibits no mass. There is no tenderness. There is no rebound and no guarding.  Musculoskeletal: Normal range of motion. He exhibits no edema.  Lymphadenopathy:    He has no cervical adenopathy.  Neurological: He is alert and oriented to person, place, and time.  Skin: Skin is warm and dry. No rash noted.  Psychiatric: He has a normal mood and affect. His behavior is normal. Judgment and thought content normal.  Nursing note and vitals reviewed.  BP 130/83 mmHg  Pulse 100  Temp(Src) 98.4 F (36.9 C) (Oral)  Ht 6' (1.829 m)  Wt 245 lb (111.131 kg)  BMI 33.22 kg/m2  Results for orders placed or performed in visit on 06/19/14  POCT Influenza A/B  Result Value Ref Range   Influenza A, POC Negative    Influenza B, POC Negative          Assessment & Plan:  1. Cough -Take Mucinex as directed and drink plenty of fluids - POCT Influenza A/B  2. Chest congestion -  Take Mucinex and drink fluids and use nasal saline - POCT Influenza A/B  3. Bronchitis with bronchospasm - predniSONE (DELTASONE) 10 MG tablet; 1 tablet 4 times a day for 2 days,  1 tablet 3 times a day for 2 days,  1 tablet 2 times a day for 2 days, 1 tablet daily for 2 days  Dispense: 20 tablet; Refill: 0 - methylPREDNISolone acetate (DEPO-MEDROL) injection 60 mg; Inject 0.75 mLs (60 mg total) into the muscle once.  4. Other seasonal allergic rhinitis -Try one of the over-the-counter  antihistamines and take as needed. Claritin once a day, or Zyrtec once a day which is more sedating and he would take this at bedtime or Allegra -Use nasal saline frequently - predniSONE (DELTASONE) 10 MG tablet; 1 tablet 4 times a day for 2 days,  1 tablet 3 times a day for 2 days,  1 tablet 2 times a day for 2 days, 1 tablet daily for 2 days  Dispense: 20 tablet; Refill: 0 - methylPREDNISolone acetate (DEPO-MEDROL) injection 60 mg; Inject 0.75 mLs (60 mg total) into the muscle once. Patient Instructions  Drink plenty of fluids Keep the house as cool as possible Use a cool mist humidifier if possible Take Mucinex, blue and white in color, maximum strength, 1 twice daily with a large glass of water Use nasal saline frequently through the day You can purchase Flonase over-the-counter and 1-2 sprays each nostril at bedtime Take prednisone as directed If you start running any fever or coughing up any yellow sputum or the wheezing gets worse call us back sooner   Nyra Capes MD

## 2014-06-22 ENCOUNTER — Telehealth: Payer: Self-pay | Admitting: Family Medicine

## 2014-06-22 MED ORDER — AZITHROMYCIN 250 MG PO TABS: ORAL_TABLET | ORAL | Status: DC

## 2014-06-22 MED ORDER — FLUTICASONE FUROATE-VILANTEROL 100-25 MCG/INH IN AEPB: 1.0000 | INHALATION_SPRAY | Freq: Every day | RESPIRATORY_TRACT | Status: DC

## 2014-06-22 MED ORDER — ALBUTEROL SULFATE HFA 108 (90 BASE) MCG/ACT IN AERS: 2.0000 | INHALATION_SPRAY | Freq: Four times a day (QID) | RESPIRATORY_TRACT | Status: DC | PRN

## 2014-06-22 NOTE — Telephone Encounter (Signed)
Please call this prescription in this patient. Albuterol inhaler as a rescue inhaler 1 puff every 4-6 hours as needed for wheezing. Breo ellipta--1 puff once daily 100 strength--- rinse mouth after using, use this inhaler regularly and the albuterol inhaler as needed for additional wheezing----please check and see if we have a sample coupon to make the cost cheaper Z-Pak as directed

## 2014-06-22 NOTE — Telephone Encounter (Signed)
Patient aware and all rx have been sent to pharmacy and coupon left up front for patient.

## 2014-06-26 ENCOUNTER — Ambulatory Visit (INDEPENDENT_AMBULATORY_CARE_PROVIDER_SITE_OTHER): Payer: 59 | Admitting: Nurse Practitioner

## 2014-06-26 ENCOUNTER — Encounter: Payer: Self-pay | Admitting: Nurse Practitioner

## 2014-06-26 VITALS — BP 134/83 | HR 68 | Temp 97.0°F | Ht 72.0 in | Wt 241.6 lb

## 2014-06-26 DIAGNOSIS — J209 Acute bronchitis, unspecified: Secondary | ICD-10-CM

## 2014-06-26 MED ORDER — HYDROCODONE-HOMATROPINE 5-1.5 MG/5ML PO SYRP: 5.0000 mL | ORAL_SOLUTION | Freq: Four times a day (QID) | ORAL | Status: DC | PRN

## 2014-06-26 MED ORDER — DOXYCYCLINE HYCLATE 100 MG PO TABS: 100.0000 mg | ORAL_TABLET | Freq: Two times a day (BID) | ORAL | Status: DC

## 2014-06-26 NOTE — Progress Notes (Signed)
   Subjective:    Patient ID: Austin Curtis, male    DOB: 11-11-1965, 49 y.o.   MRN: [id number]  HPI Patient was sseen last week by Dr. Christell ConstantMoore - dx with bronchitis- was given prednisone inhaler and zpak- was told ti take mucinex OTC- He feels no better- still has a tight cough.    Review of Systems  Constitutional: Negative for fever, chills and fatigue.  HENT: Positive for congestion and rhinorrhea. Negative for sinus pressure, sore throat and voice change.   Respiratory: Positive for cough (slight). Negative for shortness of breath.   Cardiovascular: Negative.   Gastrointestinal: Negative.   Genitourinary: Negative.   Musculoskeletal: Negative.   Neurological: Negative.   Psychiatric/Behavioral: Negative.        Objective:   Physical Exam  Constitutional: He is oriented to person, place, and time. He appears well-developed and well-nourished.  HENT:  Right Ear: External ear normal.  Left Ear: External ear normal.  Nose: Nose normal.  Mouth/Throat: Oropharynx is clear and moist.  Eyes: Pupils are equal, round, and reactive to light.  Neck: Normal range of motion. Neck supple.  Cardiovascular: Normal rate, regular rhythm and normal heart sounds.   Pulmonary/Chest: Effort normal and breath sounds normal.  Rhonchi throughout lung firlds  Abdominal: Soft. Bowel sounds are normal.  Neurological: He is alert and oriented to person, place, and time.  Skin: Skin is warm and dry.  Psychiatric: He has a normal mood and affect. His behavior is normal. Judgment and thought content normal.   BP 134/83 mmHg  Pulse 68  Temp(Src) 97 F (36.1 C) (Oral)  Ht 6' (1.829 m)  Wt 241 lb 9.6 oz (109.589 kg)  BMI 32.76 kg/m2        Assessment & Plan:   1. Acute bronchitis, unspecified organism    Meds ordered this encounter  Medications  . doxycycline (VIBRA-TABS) 100 MG tablet    Sig: Take 1 tablet (100 mg total) by mouth 2 (two) times daily. 1 po bid    Dispense:  20 tablet   Refill:  0    Order Specific Question:  Supervising Provider    Answer:  Ernestina PennaMOORE, DONALD W [[id number]]  . HYDROcodone-homatropine (HYCODAN) 5-1.5 MG/5ML syrup    Sig: Take 5 mLs by mouth every 6 (six) hours as needed for cough.    Dispense:  120 mL    Refill:  0    Order Specific Question:  Supervising Provider    Answer:  Ernestina PennaMOORE, DONALD W [[id number]]   1. Take meds as prescribed 2. Use a cool mist humidifier especially during the winter months and when heat has been humid. 3. Use saline nose sprays frequently 4. Saline irrigations of the nose can be very helpful if done frequently.  * 4X daily for 1 week*  * Use of a nettie pot can be helpful with this. Follow directions with this* 5. Drink plenty of fluids 6. Keep thermostat turn down low 7.For any cough or congestion  Use plain Mucinex- regular strength or max strength is fine   * Children- consult with Pharmacist for dosing 8. For fever or aces or pains- take tylenol or ibuprofen appropriate for age and weight.  * for fevers greater than 101 orally you may alternate ibuprofen and tylenol every  3 hours.   Mary-Margaret Daphine DeutscherMartin, FNP

## 2014-06-26 NOTE — Patient Instructions (Signed)

## 2014-07-06 ENCOUNTER — Ambulatory Visit (AMBULATORY_SURGERY_CENTER): Payer: Self-pay

## 2014-07-06 VITALS — Ht 72.0 in | Wt 236.4 lb

## 2014-07-06 DIAGNOSIS — Z1211 Encounter for screening for malignant neoplasm of colon: Secondary | ICD-10-CM

## 2014-07-06 NOTE — Progress Notes (Signed)
No allergies to eggs or soy No diet/weight loss meds No home oxygen No past exposure to anesthesia  Has email  Emmi instructions given for colonoscopy 

## 2014-07-20 ENCOUNTER — Encounter: Payer: Self-pay | Admitting: Internal Medicine

## 2014-07-20 ENCOUNTER — Ambulatory Visit (AMBULATORY_SURGERY_CENTER): Payer: 59 | Admitting: Internal Medicine

## 2014-07-20 VITALS — BP 123/85 | HR 78 | Temp 97.1°F | Resp 16 | Ht 72.0 in | Wt 236.0 lb

## 2014-07-20 DIAGNOSIS — Z1211 Encounter for screening for malignant neoplasm of colon: Secondary | ICD-10-CM

## 2014-07-20 MED ORDER — SODIUM CHLORIDE 0.9 % IV SOLN: 500.0000 mL | INTRAVENOUS | Status: DC

## 2014-07-20 NOTE — Op Note (Signed)
[facility] [address] [place], [id number]   COLONOSCOPY PROCEDURE REPORT  PATIENT: Austin CassisHarris, Austin Curtis  MR#: [id number] BIRTHDATE: [birth year] , 48  yrs. old GENDER: male ENDOSCOPIST: Iva Booparl E Zuha Dejonge, MD, [facility] PROCEDURE DATE:  07/20/2014 PROCEDURE:   Colonoscopy, screening First Screening Colonoscopy - Avg.  risk and is 50 yrs.  old or older Yes.  Prior Negative Screening - Now for repeat screening. N/A  History of Adenoma - Now for follow-up colonoscopy & has been > or = to 3 yrs.  N/A ASA CLASS:   Class II INDICATIONS:Screening for colonic neoplasia and Colorectal Neoplasm Risk Assessment for this procedure is average risk. MEDICATIONS: Propofol 300 mg IV and Monitored anesthesia care  DESCRIPTION OF PROCEDURE:   After the risks benefits and alternatives of the procedure were thoroughly explained, informed consent was obtained.  The digital rectal exam revealed no abnormalities of the rectum.   The LB [id number]  endoscope was introduced through the anus and advanced to the cecum, which was identified by both the appendix and ileocecal valve. No adverse events experienced.   The quality of the prep was excellent. (MiraLax was used)  The instrument was then slowly withdrawn as the colon was fully examined.      COLON FINDINGS: There was mild diverticulosis noted in the sigmoid colon.   The examination was otherwise normal.  Retroflexed views revealed no abnormalities. The time to cecum = 2.3 Withdrawal time = 10.9   The scope was withdrawn and the procedure completed. COMPLICATIONS: There were no immediate complications.  ENDOSCOPIC IMPRESSION: 1.   Mild diverticulosis was noted in the sigmoid colon 2.   The examination was otherwise normal  RECOMMENDATIONS: Repeat colonoscopy 10 years.  eSigned:  Iva Booparl E Shelie Lansing, MD, [facility] 07/20/2014 9:07 AM   cc: The Patient and Mary-Margaret Daphine DeutscherMartin, NP

## 2014-07-20 NOTE — Patient Instructions (Addendum)
No polyps seen. You do have diverticulosis - thickened muscle rings and pouches in the colon wall. Please read the handout about this condition. Not usually a problem for people.  Next routine colonoscopy in 10 years - 2026  I appreciate the opportunity to care for you. Iva Booparl E. Meredith Mells, MD, FACG  YOU HAD AN ENDOSCOPIC PROCEDURE TODAY AT THE [facility]:   Refer to the procedure report that was given to you for any specific questions about what was found during the examination.  If the procedure report does not answer your questions, please call your gastroenterologist to clarify.  If you requested that your care partner not be given the details of your procedure findings, then the procedure report has been included in a sealed envelope for you to review at your convenience later.  YOU SHOULD EXPECT: Some feelings of bloating in the abdomen. Passage of more gas than usual.  Walking can help get rid of the air that was put into your GI tract during the procedure and reduce the bloating. If you had a lower endoscopy (such as a colonoscopy or flexible sigmoidoscopy) you may notice spotting of blood in your stool or on the toilet paper. If you underwent a bowel prep for your procedure, you may not have a normal bowel movement for a few days.  Please Note:  You might notice some irritation and congestion in your nose or some drainage.  This is from the oxygen used during your procedure.  There is no need for concern and it should clear up in a day or so.  SYMPTOMS TO REPORT IMMEDIATELY:   Following lower endoscopy (colonoscopy or flexible sigmoidoscopy):  Excessive amounts of blood in the stool  Significant tenderness or worsening of abdominal pains  Swelling of the abdomen that is new, acute  Fever of 100F or higher   For urgent or emergent issues, a gastroenterologist can be reached at any hour by calling [phone].   DIET: Your first meal following the procedure  should be a small meal and then it is ok to progress to your normal diet. Heavy or fried foods are harder to digest and may make you feel nauseous or bloated.  Likewise, meals heavy in dairy and vegetables can increase bloating.  Drink plenty of fluids but you should avoid alcoholic beverages for 24 hours.  ACTIVITY:  You should plan to take it easy for the rest of today and you should NOT DRIVE or use heavy machinery until tomorrow (because of the sedation medicines used during the test).    FOLLOW UP: Our staff will call the number listed on your records the next business day following your procedure to check on you and address any questions or concerns that you may have regarding the information given to you following your procedure. If we do not reach you, we will leave a message.  However, if you are feeling well and you are not experiencing any problems, there is no need to return our call.  We will assume that you have returned to your regular daily activities without incident.  If any biopsies were taken you will be contacted by phone or by letter within the next 1-3 weeks.  Please call us at [phone] if you have not heard about the biopsies in 3 weeks.    SIGNATURES/CONFIDENTIALITY: You and/or your care partner have signed paperwork which will be entered into your electronic medical record.  These signatures attest to the fact that that  the information above on your After Visit Summary has been reviewed and is understood.  Full responsibility of the confidentiality of this discharge information lies with you and/or your care-partner.

## 2014-07-20 NOTE — Progress Notes (Signed)
Report to PACU, RN, vss, BBS= Clear.  

## 2014-07-23 ENCOUNTER — Telehealth: Payer: Self-pay | Admitting: *Deleted

## 2014-07-23 NOTE — Telephone Encounter (Signed)
  Follow up Call-  Call back number 07/20/2014  Post procedure Call Back phone  # [phone]  Permission to leave phone message Yes     Patient questions:  Do you have a fever, pain , or abdominal swelling? No. Pain Score  0 *  Have you tolerated food without any problems? Yes.    Have you been able to return to your normal activities? Yes.    Do you have any questions about your discharge instructions: Diet   No. Medications  No. Follow up visit  No.  Do you have questions or concerns about your Care? No.  Actions: * If pain score is 4 or above: No action needed, pain <4.

## 2015-04-18 ENCOUNTER — Ambulatory Visit (INDEPENDENT_AMBULATORY_CARE_PROVIDER_SITE_OTHER): Payer: 59 | Admitting: Family Medicine

## 2015-04-18 ENCOUNTER — Encounter: Payer: Self-pay | Admitting: Family Medicine

## 2015-04-18 VITALS — BP 135/84 | HR 88 | Temp 97.7°F | Ht 72.0 in | Wt 221.6 lb

## 2015-04-18 DIAGNOSIS — J019 Acute sinusitis, unspecified: Secondary | ICD-10-CM | POA: Diagnosis not present

## 2015-04-18 MED ORDER — FLUTICASONE PROPIONATE 50 MCG/ACT NA SUSP: 1.0000 | Freq: Two times a day (BID) | NASAL | Status: DC | PRN

## 2015-04-18 MED ORDER — FEXOFENADINE HCL 30 MG PO TBDP: 30.0000 mg | ORAL_TABLET | Freq: Every day | ORAL | Status: DC

## 2015-04-18 NOTE — Progress Notes (Signed)
BP 135/84 mmHg  Pulse 88  Temp(Src) 97.7 F (36.5 C) (Oral)  Ht 6' (1.829 m)  Wt 221 lb 9.6 oz (100.517 kg)  BMI 30.05 kg/m2   Subjective:    Patient ID: Austin Curtis, male    DOB: 25-Dec-1965, 50 y.o.   MRN: [id number]  HPI: Austin Curtis is a 50 y.o. male presenting on 04/18/2015 for Sinusitis   HPI Sinus congestion and postnasal drainage Patient is been having sinus congestion and postnasal drainage and nasal drainage and cough and sore throat for the past week. He has been trying some over-the-counter NyQuil without much success. He denies any fevers or chills and denies any shortness of breath or wheezing.  Relevant past medical, surgical, family and social history reviewed and updated as indicated. Interim medical history since our last visit reviewed. Allergies and medications reviewed and updated.  Review of Systems  Constitutional: Negative for fever and chills.  HENT: Positive for congestion, postnasal drip, rhinorrhea, sinus pressure and sore throat. Negative for ear discharge, ear pain, sneezing and voice change.   Eyes: Negative for pain, discharge, redness and visual disturbance.  Respiratory: Positive for cough. Negative for shortness of breath and wheezing.   Cardiovascular: Negative for chest pain and leg swelling.  Gastrointestinal: Negative for abdominal pain, diarrhea and constipation.  Genitourinary: Negative for difficulty urinating.  Musculoskeletal: Negative for back pain and gait problem.  Skin: Negative for rash.  Neurological: Negative for syncope, light-headedness and headaches.  All other systems reviewed and are negative.   Per HPI unless specifically indicated above     Medication List       This list is accurate as of: 04/18/15  3:30 PM.  Always use your most recent med list.               fexofenadine 30 MG disintegrating tablet  Commonly known as:  ALLEGRA ODT  Take 1 tablet (30 mg total) by mouth daily.     fluticasone 50  MCG/ACT nasal spray  Commonly known as:  FLONASE  Place 1 spray into both nostrils 2 (two) times daily as needed for allergies or rhinitis.     NYQUIL PO  Take by mouth.           Objective:    BP 135/84 mmHg  Pulse 88  Temp(Src) 97.7 F (36.5 C) (Oral)  Ht 6' (1.829 m)  Wt 221 lb 9.6 oz (100.517 kg)  BMI 30.05 kg/m2  Wt Readings from Last 3 Encounters:  04/18/15 221 lb 9.6 oz (100.517 kg)  07/20/14 236 lb (107.049 kg)  07/06/14 236 lb 6.4 oz (107.23 kg)    Physical Exam  Constitutional: He is oriented to person, place, and time. He appears well-developed and well-nourished. No distress.  HENT:  Right Ear: Tympanic membrane, external ear and ear canal normal.  Left Ear: Tympanic membrane, external ear and ear canal normal.  Nose: Mucosal edema and rhinorrhea present. No sinus tenderness. No epistaxis. Right sinus exhibits no maxillary sinus tenderness and no frontal sinus tenderness. Left sinus exhibits no maxillary sinus tenderness and no frontal sinus tenderness.  Mouth/Throat: Uvula is midline and mucous membranes are normal. Posterior oropharyngeal edema and posterior oropharyngeal erythema present. No oropharyngeal exudate or tonsillar abscesses.  Eyes: Conjunctivae and EOM are normal. Pupils are equal, round, and reactive to light. Right eye exhibits no discharge. No scleral icterus.  Cardiovascular: Normal rate, regular rhythm, normal heart sounds and intact distal pulses.   No murmur heard. Pulmonary/Chest:  Effort normal and breath sounds normal. No respiratory distress. He has no wheezes.  Musculoskeletal: Normal range of motion. He exhibits no edema.  Neurological: He is alert and oriented to person, place, and time. Coordination normal.  Skin: Skin is warm and dry. No rash noted. He is not diaphoretic.  Psychiatric: He has a normal mood and affect. His behavior is normal.  Nursing note and vitals reviewed.   Results for orders placed or performed in visit on  06/19/14  POCT Influenza A/B  Result Value Ref Range   Influenza A, POC Negative    Influenza B, POC Negative       Assessment & Plan:   Problem List Items Addressed This Visit    None    Visit Diagnoses    Acute rhinosinusitis    -  Primary    Possibly allergic, will send Flonase and an antihistamine and see if it improves, if not improved will give steroids.    Relevant Medications    Pseudoeph-Doxylamine-DM-APAP (NYQUIL PO)    fluticasone (FLONASE) 50 MCG/ACT nasal spray    fexofenadine (ALLEGRA ODT) 30 MG disintegrating tablet        Follow up plan: Return if symptoms worsen or fail to improve.  Counseling provided for all of the vaccine components No orders of the defined types were placed in this encounter.    Arville CareJoshua Aemon Koeller, MD [facility] 04/18/2015, 3:30 PM

## 2015-08-05 IMAGING — CR DG ABDOMEN 1V
1 series · 1 of 1 positions shown · non-contrast
Comparison: CT scan 02/05/08

CLINICAL DATA: Right flank pain radiating to front abdomen, history
of kidney stone

EXAM:
ABDOMEN - 1 VIEW

[view not recorded]
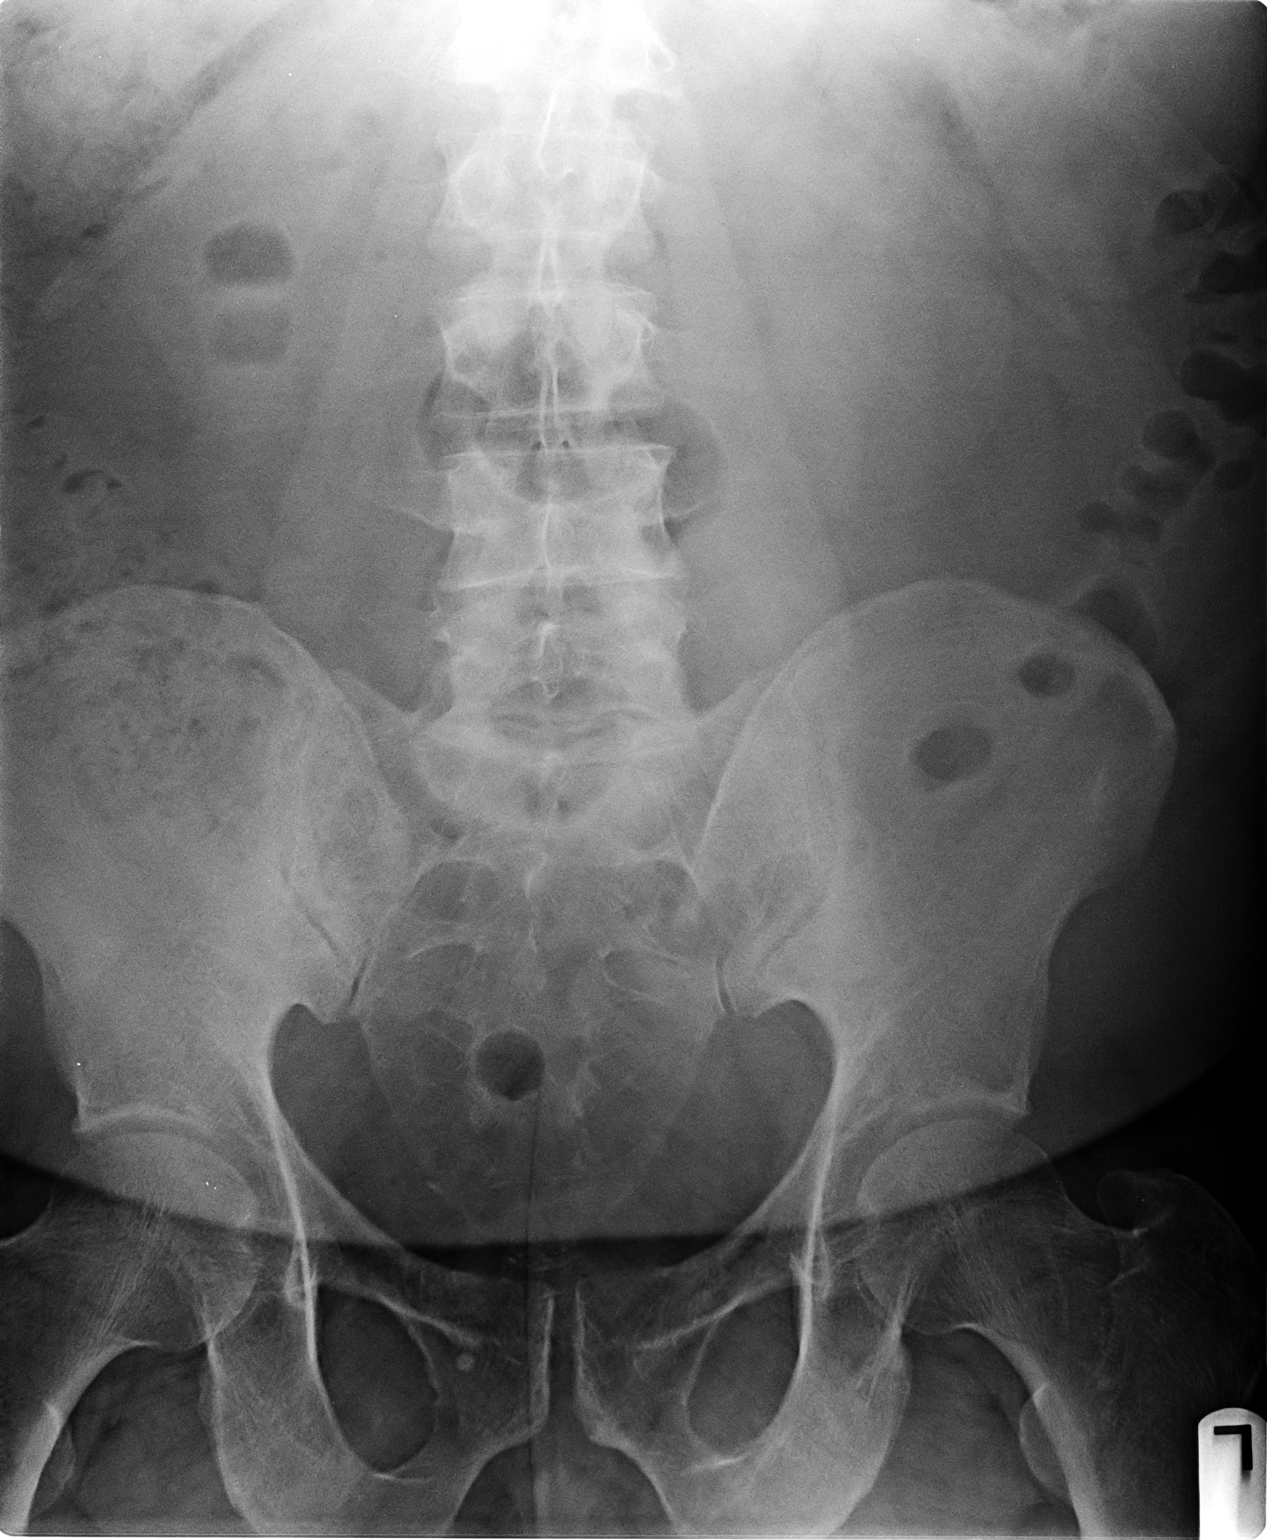

[1 of 1 positions shown; findings below may reference images not displayed]

FINDINGS: No abnormally dilated loops of bowel. There is a small phlebolith
projecting over the inferior right pelvis as seen on prior CT scan.
IMPRESSION: No significant abnormalities

## 2016-09-01 DIAGNOSIS — Z0100 Encounter for examination of eyes and vision without abnormal findings: Secondary | ICD-10-CM | POA: Diagnosis not present

## 2016-12-26 ENCOUNTER — Ambulatory Visit (INDEPENDENT_AMBULATORY_CARE_PROVIDER_SITE_OTHER): Payer: 59 | Admitting: Nurse Practitioner

## 2016-12-26 VITALS — BP 131/84 | HR 74 | Temp 97.3°F | Ht 72.0 in | Wt 255.0 lb

## 2016-12-26 DIAGNOSIS — J3089 Other allergic rhinitis: Secondary | ICD-10-CM | POA: Diagnosis not present

## 2016-12-26 DIAGNOSIS — K5900 Constipation, unspecified: Secondary | ICD-10-CM

## 2016-12-26 MED ORDER — FLUTICASONE PROPIONATE 50 MCG/ACT NA SUSP: 2.0000 | Freq: Every day | NASAL | 6 refills | Status: DC

## 2016-12-26 MED ORDER — CETIRIZINE HCL 10 MG PO CAPS: ORAL_CAPSULE | ORAL | 5 refills | Status: DC

## 2016-12-26 NOTE — Patient Instructions (Signed)
Constipation, Adult °Constipation is when a person: °· Poops (has a bowel movement) fewer times in a week than normal. °· Has a hard time pooping. °· Has poop that is dry, hard, or bigger than normal. ° °Follow these instructions at home: °Eating and drinking ° °· Eat foods that have a lot of fiber, such as: °? Fresh fruits and vegetables. °? Whole grains. °? Beans. °· Eat less of foods that are high in fat, low in fiber, or overly processed, such as: °? French fries. °? Hamburgers. °? Cookies. °? Candy. °? Soda. °· Drink enough fluid to keep your pee (urine) clear or pale yellow. °General instructions °· Exercise regularly or as told by your doctor. °· Go to the restroom when you feel like you need to poop. Do not hold it in. °· Take over-the-counter and prescription medicines only as told by your doctor. These include any fiber supplements. °· Do pelvic floor retraining exercises, such as: °? Doing deep breathing while relaxing your lower belly (abdomen). °? Relaxing your pelvic floor while pooping. °· Watch your condition for any changes. °· Keep all follow-up visits as told by your doctor. This is important. °Contact a doctor if: °· You have pain that gets worse. °· You have a fever. °· You have not pooped for 4 days. °· You throw up (vomit). °· You are not hungry. °· You lose weight. °· You are bleeding from the anus. °· You have thin, pencil-like poop (stool). °Get help right away if: °· You have a fever, and your symptoms suddenly get worse. °· You leak poop or have blood in your poop. °· Your belly feels hard or bigger than normal (is bloated). °· You have very bad belly pain. °· You feel dizzy or you faint. °This information is not intended to replace advice given to you by your health care provider. Make sure you discuss any questions you have with your health care provider. °Document Released: 09/09/2007 Document Revised: 10/11/2015 Document Reviewed: 09/11/2015 °Elsevier Interactive Patient Education ©  2017 Elsevier Inc. ° °

## 2016-12-26 NOTE — Progress Notes (Signed)
   Subjective:    Patient ID: Austin Curtis, male    DOB: [birth year], 51 y.o.   MRN: [id number]  HPI Patient comes in today with several complaints: -constipation- has developed over the last year. Sometimes he will go 4-5 days between bowel movements. He has not tried any OTC meds. - CHest congestion and cough. SLight wheezing and hoarseness.    Review of Systems  Constitutional: Negative for chills and fever.  HENT: Positive for congestion, rhinorrhea and sneezing. Negative for trouble swallowing.   Respiratory: Positive for cough.   Cardiovascular: Negative.   Gastrointestinal: Negative.   Genitourinary: Negative.   Neurological: Negative.   Psychiatric/Behavioral: Negative.   All other systems reviewed and are negative.      Objective:   Physical Exam  Constitutional: He is oriented to person, place, and time. He appears well-developed and well-nourished. No distress.  HENT:  Right Ear: Hearing, tympanic membrane, external ear and ear canal normal.  Left Ear: Hearing, tympanic membrane, external ear and ear canal normal.  Nose: Mucosal edema and rhinorrhea present. Right sinus exhibits no maxillary sinus tenderness and no frontal sinus tenderness. Left sinus exhibits no maxillary sinus tenderness and no frontal sinus tenderness.  Mouth/Throat: Uvula is midline, oropharynx is clear and moist and mucous membranes are normal.  Eyes: Pupils are equal, round, and reactive to light.  Neck: Normal range of motion. Neck supple.  Cardiovascular: Normal rate and regular rhythm.   Pulmonary/Chest: Effort normal and breath sounds normal. No respiratory distress. He has no wheezes. He has no rales.  Abdominal: Soft. He exhibits no distension. There is no tenderness. There is no rebound.  Lymphadenopathy:    He has no cervical adenopathy.  Neurological: He is alert and oriented to person, place, and time.  Skin: Skin is warm.  Psychiatric: He has a normal mood and affect. His behavior  is normal. Judgment and thought content normal.   BP 131/84   Pulse 74   Temp (!) 97.3 F (36.3 C) (Oral)   Ht 6' (1.829 m)   Wt 255 lb (115.7 kg)   BMI 34.58 kg/m       Assessment & Plan:  1. Seasonal allergic rhinitis due to other allergic trigger Force fluids - fluticasone (FLONASE) 50 MCG/ACT nasal spray; Place 2 sprays into both nostrils daily.  Dispense: 16 g; Refill: 6 - Cetirizine HCl 10 MG CAPS; 1 po qd  Dispense: 30 capsule; Refill: 5  2. Constipation, unspecified constipation type miralax daily Increase fiber in diet  RTO prn  Mary-Margaret Daphine Deutscher, FNP

## 2017-08-29 DIAGNOSIS — L237 Allergic contact dermatitis due to plants, except food: Secondary | ICD-10-CM | POA: Diagnosis not present

## 2017-10-04 ENCOUNTER — Other Ambulatory Visit: Payer: Self-pay | Admitting: Nurse Practitioner

## 2017-10-04 DIAGNOSIS — J3089 Other allergic rhinitis: Secondary | ICD-10-CM

## 2017-12-13 DIAGNOSIS — Z23 Encounter for immunization: Secondary | ICD-10-CM | POA: Diagnosis not present

## 2018-04-07 ENCOUNTER — Encounter: Payer: Self-pay | Admitting: Internal Medicine

## 2018-05-13 ENCOUNTER — Ambulatory Visit: Payer: 59 | Admitting: Internal Medicine

## 2018-06-08 DIAGNOSIS — Z23 Encounter for immunization: Secondary | ICD-10-CM | POA: Diagnosis not present

## 2019-01-30 ENCOUNTER — Other Ambulatory Visit: Payer: Self-pay

## 2019-01-31 ENCOUNTER — Encounter: Payer: Self-pay | Admitting: Family Medicine

## 2019-01-31 ENCOUNTER — Ambulatory Visit (INDEPENDENT_AMBULATORY_CARE_PROVIDER_SITE_OTHER): Payer: 59 | Admitting: Family Medicine

## 2019-01-31 VITALS — BP 133/78 | HR 95 | Temp 96.4°F | Ht 73.0 in | Wt 275.8 lb

## 2019-01-31 DIAGNOSIS — Z0001 Encounter for general adult medical examination with abnormal findings: Secondary | ICD-10-CM

## 2019-01-31 DIAGNOSIS — K219 Gastro-esophageal reflux disease without esophagitis: Secondary | ICD-10-CM

## 2019-01-31 DIAGNOSIS — Z1159 Encounter for screening for other viral diseases: Secondary | ICD-10-CM

## 2019-01-31 DIAGNOSIS — Z0000 Encounter for general adult medical examination without abnormal findings: Secondary | ICD-10-CM

## 2019-01-31 DIAGNOSIS — E669 Obesity, unspecified: Secondary | ICD-10-CM

## 2019-01-31 NOTE — Patient Instructions (Addendum)
Heartburn: you should try the medication class of H2 blockers - this includes famotidine (Pepcid) OR cimetidine (Tagamet. You can buy these over the counter.   Food Choices for Gastroesophageal Reflux Disease, Adult When you have gastroesophageal reflux disease (GERD), the foods you eat and your eating habits are very important. Choosing the right foods can help ease your discomfort. Think about working with a nutrition specialist (dietitian) to help you make good choices. What are tips for following this plan?  Meals  Choose healthy foods that are low in fat, such as fruits, vegetables, whole grains, low-fat dairy products, and lean meat, fish, and poultry.  Eat small meals often instead of 3 large meals a day. Eat your meals slowly, and in a place where you are relaxed. Avoid bending over or lying down until 2-3 hours after eating.  Avoid eating meals 2-3 hours before bed.  Avoid drinking a lot of liquid with meals.  Cook foods using methods other than frying. Bake, grill, or broil food instead.  Avoid or limit: ? Chocolate. ? Peppermint or spearmint. ? Alcohol. ? Pepper. ? Black and decaffeinated coffee. ? Black and decaffeinated tea. ? Bubbly (carbonated) soft drinks. ? Caffeinated energy drinks and soft drinks.  Limit high-fat foods such as: ? Fatty meat or fried foods. ? Whole milk, cream, butter, or ice cream. ? Nuts and nut butters. ? Pastries, donuts, and sweets made with butter or shortening.  Avoid foods that cause symptoms. These foods may be different for everyone. Common foods that cause symptoms include: ? Tomatoes. ? Oranges, lemons, and limes. ? Peppers. ? Spicy food. ? Onions and garlic. ? Vinegar. Lifestyle  Maintain a healthy weight. Ask your doctor what weight is healthy for you. If you need to lose weight, work with your doctor to do so safely.  Exercise for at least 30 minutes for 5 or more days each week, or as told by your doctor.  Wear  loose-fitting clothes.  Do not smoke. If you need help quitting, ask your doctor.  Sleep with the head of your bed higher than your feet. Use a wedge under the mattress or blocks under the bed frame to raise the head of the bed. Summary  When you have gastroesophageal reflux disease (GERD), food and lifestyle choices are very important in easing your symptoms.  Eat small meals often instead of 3 large meals a day. Eat your meals slowly, and in a place where you are relaxed.  Limit high-fat foods such as fatty meat or fried foods.  Avoid bending over or lying down until 2-3 hours after eating.  Avoid peppermint and spearmint, caffeine, alcohol, and chocolate. This information is not intended to replace advice given to you by your health care provider. Make sure you discuss any questions you have with your health care provider. Document Released: 09/22/2011 Document Revised: 07/14/2018 Document Reviewed: 04/28/2016 Elsevier Patient Education  2020 Elsevier Inc.   Preventive Care 2-15 Years Old, Male Preventive care refers to lifestyle choices and visits with your health care provider that can promote health and wellness. This includes:  A yearly physical exam. This is also called an annual well check.  Regular dental and eye exams.  Immunizations.  Screening for certain conditions.  Healthy lifestyle choices, such as eating a healthy diet, getting regular exercise, not using drugs or products that contain nicotine and tobacco, and limiting alcohol use. What can I expect for my preventive care visit? Physical exam Your health care provider will check:  Height and weight. These may be used to calculate body mass index (BMI), which is a measurement that tells if you are at a healthy weight.  Heart rate and blood pressure.  Your skin for abnormal spots. Counseling Your health care provider may ask you questions about:  Alcohol, tobacco, and drug use.  Emotional well-being.   Home and relationship well-being.  Sexual activity.  Eating habits.  Work and work Statistician. What immunizations do I need?  Influenza (flu) vaccine  This is recommended every year. Tetanus, diphtheria, and pertussis (Tdap) vaccine  You may need a Td booster every 10 years. Varicella (chickenpox) vaccine  You may need this vaccine if you have not already been vaccinated. Zoster (shingles) vaccine  You may need this after age 75. Measles, mumps, and rubella (MMR) vaccine  You may need at least one dose of MMR if you were born in 1957 or later. You may also need a second dose. Pneumococcal conjugate (PCV13) vaccine  You may need this if you have certain conditions and were not previously vaccinated. Pneumococcal polysaccharide (PPSV23) vaccine  You may need one or two doses if you smoke cigarettes or if you have certain conditions. Meningococcal conjugate (MenACWY) vaccine  You may need this if you have certain conditions. Hepatitis A vaccine  You may need this if you have certain conditions or if you travel or work in places where you may be exposed to hepatitis A. Hepatitis B vaccine  You may need this if you have certain conditions or if you travel or work in places where you may be exposed to hepatitis B. Haemophilus influenzae type b (Hib) vaccine  You may need this if you have certain risk factors. Human papillomavirus (HPV) vaccine  If recommended by your health care provider, you may need three doses over 6 months. You may receive vaccines as individual doses or as more than one vaccine together in one shot (combination vaccines). Talk with your health care provider about the risks and benefits of combination vaccines. What tests do I need? Blood tests  Lipid and cholesterol levels. These may be checked every 5 years, or more frequently if you are over 19 years old.  Hepatitis C test.  Hepatitis B test. Screening  Lung cancer screening. You may have  this screening every year starting at age 43 if you have a 30-pack-year history of smoking and currently smoke or have quit within the past 15 years.  Prostate cancer screening. Recommendations will vary depending on your family history and other risks.  Colorectal cancer screening. All adults should have this screening starting at age 57 and continuing until age 58. Your health care provider may recommend screening at age 61 if you are at increased risk. You will have tests every 1-10 years, depending on your results and the type of screening test.  Diabetes screening. This is done by checking your blood sugar (glucose) after you have not eaten for a while (fasting). You may have this done every 1-3 years.  Sexually transmitted disease (STD) testing. Follow these instructions at home: Eating and drinking  Eat a diet that includes fresh fruits and vegetables, whole grains, lean protein, and low-fat dairy products.  Take vitamin and mineral supplements as recommended by your health care provider.  Do not drink alcohol if your health care provider tells you not to drink.  If you drink alcohol: ? Limit how much you have to 0-2 drinks a day. ? Be aware of how much alcohol is in  your drink. In the [place] one drink equals one 12 oz bottle of beer (355 mL), one 5 oz glass of wine (148 mL), or one 1 oz glass of hard liquor (44 mL). Lifestyle  Take daily care of your teeth and gums.  Stay active. Exercise for at least 30 minutes on 5 or more days each week.  Do not use any products that contain nicotine or tobacco, such as cigarettes, e-cigarettes, and chewing tobacco. If you need help quitting, ask your health care provider.  If you are sexually active, practice safe sex. Use a condom or other form of protection to prevent STIs (sexually transmitted infections).  Talk with your health care provider about taking a low-dose aspirin every day starting at age 55. What's next?  Go to your health  care provider once a year for a well check visit.  Ask your health care provider how often you should have your eyes and teeth checked.  Stay up to date on all vaccines. This information is not intended to replace advice given to you by your health care provider. Make sure you discuss any questions you have with your health care provider. Document Released: 04/19/2015 Document Revised: 03/17/2018 Document Reviewed: 03/17/2018 Elsevier Patient Education  2020 [employer].

## 2019-01-31 NOTE — Progress Notes (Signed)
Assessment & Plan:  1. Well adult exam - Patient reports he gets his lab work done yearly with work and just had it completed a few weeks ago; I asked that he bring [place] a copy to view and scan into his chart. He is UTD with his colonoscopy, TDAP, Shingrix (getting records), and influenza vaccines. Declined HIV screening.   2. Gastroesophageal reflux disease, unspecified whether esophagitis present - Encouraged patient to try a H2 blocker versus a PPI. Education provided on GERD.   3. Obesity (BMI 30-39.9) - Diet and exercise encouraged.   4. Encounter for hepatitis C screening test for low risk patient - Hepatitis C antibody   Follow-up: Return if symptoms worsen or fail to improve.   Hendricks Limes, MSN, APRN, FNP-C [facility]  Subjective:  Patient ID: Austin Curtis, male    DOB: [birth year]  Age: 53 y.o. MRN: [id number]  Patient Care Team: Chevis Pretty, FNP as PCP - General (Family Medicine) Vaughan Basta, Rona Ravens, NP as Nurse Practitioner (Internal Medicine)   CC:  Chief Complaint  Patient presents with   Annual Exam   Heartburn    x 3 months    HPI Austin Curtis presents for his annual physical.   Occupation: Economist, Marital status: married, Substance use: none Diet: regular, Exercise: walks a couple miles daily Last eye exam: one year ago, due next month Last dental exam: goes regularly every 6 months Last colonoscopy: 2016 Hepatitis C Screening: today PSA: 2016 Immunizations: Flu Vaccine: completed  Tdap Vaccine: up to date  Shingrix Vaccine: up to date  DEPRESSION SCREENING PHQ 2/9 Scores 01/31/2019 12/26/2016 05/02/2014  PHQ - 2 Score 0 0 0     Patient reports today that he has been experiencing acid reflux for the past three months. He does eat a lot of spicy foods and knows that is one of his triggers. He feels is also occurs when he is not eating spicy foods. He has been taking Prilosec as needed which is helpful.    Review of Systems  Constitutional: Negative for chills, fever, malaise/fatigue and weight loss.  HENT: Negative for congestion, ear discharge, ear pain, nosebleeds, sinus pain, sore throat and tinnitus.   Eyes: Negative for blurred vision, double vision, pain, discharge and redness.  Respiratory: Negative for cough, shortness of breath and wheezing.   Cardiovascular: Negative for chest pain, palpitations and leg swelling.  Gastrointestinal: Positive for heartburn (over the past 3 months). Negative for abdominal pain, constipation, diarrhea, nausea and vomiting.  Genitourinary: Negative for dysuria, frequency and urgency.       Denies trouble initiating a urine stream, weak stream, split stream, and dribbling.  Musculoskeletal: Negative for myalgias.  Skin: Negative for rash.  Neurological: Negative for dizziness, seizures, weakness and headaches.  Psychiatric/Behavioral: Negative for depression, substance abuse and suicidal ideas. The patient is not nervous/anxious.     No current outpatient medications on file.  Allergies  Allergen Reactions   Penicillins Anaphylaxis    Past Medical History:  Diagnosis Date   Abdominal pain    Blood in stool    Flatus    Hyperuricemia 04/23/2013   Kidney stones     Past Surgical History:  Procedure Laterality Date   none      Family History  Problem Relation Age of Onset   Bladder Cancer Father    Early death Sister        MVA at 26 years of age   Colon cancer  Neg Hx     Social History   Socioeconomic History   Marital status: Married    Spouse name: Not on file   Number of children: Not on file   Years of education: Not on file   Highest education level: Not on file  Occupational History   Not on file  Social Needs   Financial resource strain: Not on file   Food insecurity    Worry: Not on file    Inability: Not on file   Transportation needs    Medical: Not on file    Non-medical: Not on file   Tobacco Use   Smoking status: Never Smoker   Smokeless tobacco: Never Used  Substance and Sexual Activity   Alcohol use: No    Alcohol/week: 0.0 standard drinks   Drug use: No   Sexual activity: Not on file  Lifestyle   Physical activity    Days per week: Not on file    Minutes per session: Not on file   Stress: Not on file  Relationships   Social connections    Talks on phone: Not on file    Gets together: Not on file    Attends religious service: Not on file    Active member of club or organization: Not on file    Attends meetings of clubs or organizations: Not on file    Relationship status: Not on file   Intimate partner violence    Fear of current or ex partner: Not on file    Emotionally abused: Not on file    Physically abused: Not on file    Forced sexual activity: Not on file  Other Topics Concern   Not on file  Social History Narrative   Not on file      Objective:    BP 133/78    Pulse 95    Temp (!) 96.4 F (35.8 C) (Temporal)    Ht 6\' 1"  (1.854 m)    Wt 275 lb 12.8 oz (125.1 kg)    SpO2 96%    BMI 36.39 kg/m   Wt Readings from Last 3 Encounters:  01/31/19 275 lb 12.8 oz (125.1 kg)  12/26/16 255 lb (115.7 kg)  04/18/15 221 lb 9.6 oz (100.5 kg)    Physical Exam Vitals signs reviewed.  Constitutional:      General: He is not in acute distress.    Appearance: Normal appearance. He is obese. He is not ill-appearing, toxic-appearing or diaphoretic.  HENT:     Head: Normocephalic and atraumatic.     Right Ear: Tympanic membrane, ear canal and external ear normal. There is no impacted cerumen.     Left Ear: Tympanic membrane, ear canal and external ear normal. There is no impacted cerumen.     Nose: Nose normal. No congestion or rhinorrhea.     Mouth/Throat:     Mouth: Mucous membranes are moist.     Pharynx: Oropharynx is clear. No oropharyngeal exudate or posterior oropharyngeal erythema.  Eyes:     General: No scleral icterus.        Right eye: No discharge.        Left eye: No discharge.     Conjunctiva/sclera: Conjunctivae normal.     Pupils: Pupils are equal, round, and reactive to light.  Neck:     Musculoskeletal: Normal range of motion and neck supple. No neck rigidity or muscular tenderness.  Cardiovascular:     Rate and Rhythm: Normal rate and regular rhythm.  Heart sounds: Normal heart sounds. No murmur. No friction rub. No gallop.   Pulmonary:     Effort: Pulmonary effort is normal. No respiratory distress.     Breath sounds: Normal breath sounds. No stridor. No wheezing, rhonchi or rales.  Abdominal:     General: Abdomen is flat. Bowel sounds are normal. There is no distension.     Palpations: Abdomen is soft. There is no mass.     Tenderness: There is no abdominal tenderness. There is no guarding or rebound.     Hernia: No hernia is present.  Musculoskeletal: Normal range of motion.     Right lower leg: No edema.     Left lower leg: No edema.  Lymphadenopathy:     Cervical: No cervical adenopathy.  Skin:    General: Skin is warm and dry.     Capillary Refill: Capillary refill takes less than 2 seconds.  Neurological:     General: No focal deficit present.     Mental Status: He is alert and oriented to person, place, and time. Mental status is at baseline.  Psychiatric:        Mood and Affect: Mood normal.        Behavior: Behavior normal.        Thought Content: Thought content normal.        Judgment: Judgment normal.     Lab Results  Component Value Date   WBC 11.4 (A) 04/21/2013   HGB 15.7 04/21/2013   HCT 47.4 04/21/2013   MCV 88.3 04/21/2013   Lab Results  Component Value Date   NA 139 05/02/2014   K 4.4 05/02/2014   CO2 25 05/02/2014   GLUCOSE 87 05/02/2014   BUN 14 05/02/2014   CREATININE 0.98 05/02/2014   BILITOT 0.4 05/02/2014   ALKPHOS 114 05/02/2014   AST 29 05/02/2014   ALT 47 (H) 05/02/2014   PROT 6.7 05/02/2014   ALBUMIN 4.5 05/02/2014   CALCIUM 9.3 05/02/2014

## 2019-02-01 LAB — HEPATITIS C ANTIBODY: Hep C Virus Ab: <0.1 s/co ratio (ref 0.0–0.9)

## 2020-05-30 ENCOUNTER — Telehealth: Payer: Self-pay

## 2020-05-30 NOTE — Telephone Encounter (Signed)
Fine with me

## 2020-05-30 NOTE — Telephone Encounter (Signed)
Appointment scheduled.

## 2020-05-30 NOTE — Telephone Encounter (Signed)
Pts wife called to schedule pt an appt to have CPE and talk about some issues pt has been having.  Wife requested to switch pts PCP. Would like to switch to [employer], which is who saw pt last for CPE.  Please advise and call pts wife to confirm and schedule.

## 2020-06-07 ENCOUNTER — Ambulatory Visit (INDEPENDENT_AMBULATORY_CARE_PROVIDER_SITE_OTHER): Payer: 59 | Admitting: Family Medicine

## 2020-06-07 ENCOUNTER — Encounter: Payer: Self-pay | Admitting: Family Medicine

## 2020-06-07 ENCOUNTER — Other Ambulatory Visit: Payer: Self-pay | Admitting: Family Medicine

## 2020-06-07 ENCOUNTER — Other Ambulatory Visit: Payer: Self-pay

## 2020-06-07 VITALS — BP 155/91 | HR 95 | Temp 97.9°F | Ht 73.0 in | Wt 284.6 lb

## 2020-06-07 DIAGNOSIS — R030 Elevated blood-pressure reading, without diagnosis of hypertension: Secondary | ICD-10-CM | POA: Diagnosis not present

## 2020-06-07 DIAGNOSIS — E669 Obesity, unspecified: Secondary | ICD-10-CM

## 2020-06-07 DIAGNOSIS — K5904 Chronic idiopathic constipation: Secondary | ICD-10-CM | POA: Diagnosis not present

## 2020-06-07 DIAGNOSIS — I10 Essential (primary) hypertension: Secondary | ICD-10-CM | POA: Insufficient documentation

## 2020-06-07 MED ORDER — LUBIPROSTONE 8 MCG PO CAPS: 8.0000 ug | ORAL_CAPSULE | Freq: Two times a day (BID) | ORAL | 2 refills | Status: DC

## 2020-06-07 MED ORDER — PHENTERMINE HCL 37.5 MG PO TABS: 37.5000 mg | ORAL_TABLET | Freq: Every day | ORAL | 0 refills | Status: DC

## 2020-06-07 NOTE — Progress Notes (Signed)
Assessment & Plan:  1. Obesity (BMI 30-39.9) Continue diet and exercise.  - phentermine (ADIPEX-P) 37.5 MG tablet; Take 1 tablet (37.5 mg total) by mouth daily before breakfast.  Dispense: 30 tablet; Refill: 0  2. Chronic idiopathic constipation Advised he may try Miralax twice daily before starting Amitiza. Increase exercise and water intake. - lubiprostone (AMITIZA) 8 MCG capsule; Take 1 capsule (8 mcg total) by mouth 2 (two) times daily with a meal.  Dispense: 60 capsule; Refill: 2  3. Elevated BP without diagnosis of hypertension Patient to monitor BP at home and keep a log to bring with him to his next appointment. Education provided on the DASH diet.    Return in about 4 weeks (around 07/05/2020) for weight, constipation, & BP.  Austin Boston, MSN, APRN, FNP-C [facility]  Subjective:    Patient ID: Austin Curtis, male    DOB: Dec 22, 1965, 56 y.o.   MRN: [id number]  Patient Care Team: Gwenlyn Fudge, FNP as PCP - General (Family Medicine) Valetta Fuller, Brand Males, NP (Inactive) as Nurse Practitioner (Internal Medicine)   Chief Complaint:  Chief Complaint  Patient presents with  . Establish Care  . Weight Loss  . Constipation    OTC not helping. Patient states this has been going on x 65yr.    HPI: Austin Curtis is a 55 y.o. male presenting on 06/07/2020 for Establish Care, Weight Loss, and Constipation (OTC not helping. Patient states this has been going on x 71yr.)  Patient would like something to help with weight loss. He is eating healthy and has started going to the gym. He reports when he goes to the gym the stress of the weight causes pain in his hips and knees. He is hoping to get something to help him get the weight off quicker.   He is also concerned about constipation. He has tried OTC Miralax, Metamucil, and Colace. He reports Miralax works the best but even taking it daily he only has a bowel movement twice weekly. He does experience straining  with hard stools.   Elevated BP: patient has a machine at home but does not monitor his BP. He does eat a low salt diet. He has been going to the gym.   Patient reports he gets lab work completed yearly with his employer.    Social history:  Relevant past medical, surgical, family and social history reviewed and updated as indicated. Interim medical history since our last visit reviewed.  Allergies and medications reviewed and updated.  DATA REVIEWED: CHART IN EPIC  ROS: Negative unless specifically indicated above in HPI.    Current Outpatient Medications:  .  lubiprostone (AMITIZA) 8 MCG capsule, Take 1 capsule (8 mcg total) by mouth 2 (two) times daily with a meal., Disp: 60 capsule, Rfl: 2 .  phentermine (ADIPEX-P) 37.5 MG tablet, Take 1 tablet (37.5 mg total) by mouth daily before breakfast., Disp: 30 tablet, Rfl: 0   Allergies  Allergen Reactions  . Penicillins Anaphylaxis   Past Medical History:  Diagnosis Date  . Blood in stool   . Hyperuricemia 04/23/2013  . Kidney stones     Past Surgical History:  Procedure Laterality Date  . NO PAST SURGERIES      Social History   Socioeconomic History  . Marital status: Married    Spouse name: Not on file  . Number of children: Not on file  . Years of education: Not on file  . Highest education level: Not on  file  Occupational History  . Not on file  Tobacco Use  . Smoking status: Never Smoker  . Smokeless tobacco: Never Used  Vaping Use  . Vaping Use: Never used  Substance and Sexual Activity  . Alcohol use: No    Alcohol/week: 0.0 standard drinks  . Drug use: No  . Sexual activity: Not on file  Other Topics Concern  . Not on file  Social History Narrative  . Not on file   Social Determinants of Health   Financial Resource Strain: Not on file  Food Insecurity: Not on file  Transportation Needs: Not on file  Physical Activity: Not on file  Stress: Not on file  Social Connections: Not on file  Intimate  Partner Violence: Not on file        Objective:    BP (!) 155/91   Pulse 95   Temp 97.9 F (36.6 C) (Temporal)   Ht 6\' 1"  (1.854 m)   Wt 284 lb 9.6 oz (129.1 kg)   SpO2 96%   BMI 37.55 kg/m   Wt Readings from Last 3 Encounters:  06/07/20 284 lb 9.6 oz (129.1 kg)  01/31/19 275 lb 12.8 oz (125.1 kg)  12/26/16 255 lb (115.7 kg)    Physical Exam Vitals reviewed.  Constitutional:      General: He is not in acute distress.    Appearance: Normal appearance. He is morbidly obese. He is not ill-appearing, toxic-appearing or diaphoretic.  HENT:     Head: Normocephalic and atraumatic.  Eyes:     General: No scleral icterus.       Right eye: No discharge.        Left eye: No discharge.     Conjunctiva/sclera: Conjunctivae normal.  Cardiovascular:     Rate and Rhythm: Normal rate and regular rhythm.     Heart sounds: Normal heart sounds. No murmur heard. No friction rub. No gallop.   Pulmonary:     Effort: Pulmonary effort is normal. No respiratory distress.     Breath sounds: Normal breath sounds. No stridor. No wheezing, rhonchi or rales.  Musculoskeletal:        General: Normal range of motion.     Cervical back: Normal range of motion.  Skin:    General: Skin is warm and dry.  Neurological:     Mental Status: He is alert and oriented to person, place, and time. Mental status is at baseline.  Psychiatric:        Mood and Affect: Mood normal.        Behavior: Behavior normal.        Thought Content: Thought content normal.        Judgment: Judgment normal.     No results found for: TSH Lab Results  Component Value Date   WBC 11.4 (A) 04/21/2013   HGB 15.7 04/21/2013   HCT 47.4 04/21/2013   MCV 88.3 04/21/2013   Lab Results  Component Value Date   NA 139 05/02/2014   K 4.4 05/02/2014   CO2 25 05/02/2014   GLUCOSE 87 05/02/2014   BUN 14 05/02/2014   CREATININE 0.98 05/02/2014   BILITOT 0.4 05/02/2014   ALKPHOS 114 05/02/2014   AST 29 05/02/2014   ALT 47  (H) 05/02/2014   PROT 6.7 05/02/2014   ALBUMIN 4.5 05/02/2014   CALCIUM 9.3 05/02/2014   No results found for: CHOL No results found for: HDL No results found for: LDLCALC No results found for: TRIG No results found  for: CHOLHDL No results found for: [id number]

## 2020-06-07 NOTE — Patient Instructions (Signed)
Monitor your blood pressure at home and keep a log to bring back with you to your next appointment.   You may increase Miralax to twice daily if you would like to try this before the Amitiza.    DASH Eating Plan DASH stands for Dietary Approaches to Stop Hypertension. The DASH eating plan is a healthy eating plan that has been shown to:  Reduce high blood pressure (hypertension).  Reduce your risk for type 2 diabetes, heart disease, and stroke.  Help with weight loss. What are tips for following this plan? Reading food labels  Check food labels for the amount of salt (sodium) per serving. Choose foods with less than 5 percent of the Daily Value of sodium. Generally, foods with less than 300 milligrams (mg) of sodium per serving fit into this eating plan.  To find whole grains, look for the word "whole" as the first word in the ingredient list. Shopping  Buy products labeled as "low-sodium" or "no salt added."  Buy fresh foods. Avoid canned foods and pre-made or frozen meals. Cooking  Avoid adding salt when cooking. Use salt-free seasonings or herbs instead of table salt or sea salt. Check with your health care provider or pharmacist before using salt substitutes.  Do not fry foods. Cook foods using healthy methods such as baking, boiling, grilling, roasting, and broiling instead.  Cook with heart-healthy oils, such as olive, canola, avocado, soybean, or sunflower oil. Meal planning  Eat a balanced diet that includes: ? 4 or more servings of fruits and 4 or more servings of vegetables each day. Try to fill one-half of your plate with fruits and vegetables. ? 6-8 servings of whole grains each day. ? Less than 6 oz (170 g) of lean meat, poultry, or fish each day. A 3-oz (85-g) serving of meat is about the same size as a deck of cards. One egg equals 1 oz (28 g). ? 2-3 servings of low-fat dairy each day. One serving is 1 cup (237 mL). ? 1 serving of nuts, seeds, or beans 5 times  each week. ? 2-3 servings of heart-healthy fats. Healthy fats called omega-3 fatty acids are found in foods such as walnuts, flaxseeds, fortified milks, and eggs. These fats are also found in cold-water fish, such as sardines, salmon, and mackerel.  Limit how much you eat of: ? Canned or prepackaged foods. ? Food that is high in trans fat, such as some fried foods. ? Food that is high in saturated fat, such as fatty meat. ? Desserts and other sweets, sugary drinks, and other foods with added sugar. ? Full-fat dairy products.  Do not salt foods before eating.  Do not eat more than 4 egg yolks a week.  Try to eat at least 2 vegetarian meals a week.  Eat more home-cooked food and less restaurant, buffet, and fast food.   Lifestyle  When eating at a restaurant, ask that your food be prepared with less salt or no salt, if possible.  If you drink alcohol: ? Limit how much you use to:  0-1 drink a day for women who are not pregnant.  0-2 drinks a day for men. ? Be aware of how much alcohol is in your drink. In the [place] one drink equals one 12 oz bottle of beer (355 mL), one 5 oz glass of wine (148 mL), or one 1 oz glass of hard liquor (44 mL). General information  Avoid eating more than 2,300 mg of salt a day. If you  have hypertension, you may need to reduce your sodium intake to 1,500 mg a day.  Work with your health care provider to maintain a healthy body weight or to lose weight. Ask what an ideal weight is for you.  Get at least 30 minutes of exercise that causes your heart to beat faster (aerobic exercise) most days of the week. Activities may include walking, swimming, or biking.  Work with your health care provider or dietitian to adjust your eating plan to your individual calorie needs. What foods should I eat? Fruits All fresh, dried, or frozen fruit. Canned fruit in natural juice (without added sugar). Vegetables Fresh or frozen vegetables (raw, steamed, roasted, or  grilled). Low-sodium or reduced-sodium tomato and vegetable juice. Low-sodium or reduced-sodium tomato sauce and tomato paste. Low-sodium or reduced-sodium canned vegetables. Grains Whole-grain or whole-wheat bread. Whole-grain or whole-wheat pasta. Brown rice. Orpah Cobb. Bulgur. Whole-grain and low-sodium cereals. Pita bread. Low-fat, low-sodium crackers. Whole-wheat flour tortillas. Meats and other proteins Skinless chicken or [place]. Ground chicken or [place]. Pork with fat trimmed off. Fish and seafood. Egg whites. Dried beans, peas, or lentils. Unsalted nuts, nut butters, and seeds. Unsalted canned beans. Lean cuts of beef with fat trimmed off. Low-sodium, lean precooked or cured meat, such as sausages or meat loaves. Dairy Low-fat (1%) or fat-free (skim) milk. Reduced-fat, low-fat, or fat-free cheeses. Nonfat, low-sodium ricotta or cottage cheese. Low-fat or nonfat yogurt. Low-fat, low-sodium cheese. Fats and oils Soft margarine without trans fats. Vegetable oil. Reduced-fat, low-fat, or light mayonnaise and salad dressings (reduced-sodium). Canola, safflower, olive, avocado, soybean, and sunflower oils. Avocado. Seasonings and condiments Herbs. Spices. Seasoning mixes without salt. Other foods Unsalted popcorn and pretzels. Fat-free sweets. The items listed above may not be a complete list of foods and beverages you can eat. Contact a dietitian for more information. What foods should I avoid? Fruits Canned fruit in a light or heavy syrup. Fried fruit. Fruit in cream or butter sauce. Vegetables Creamed or fried vegetables. Vegetables in a cheese sauce. Regular canned vegetables (not low-sodium or reduced-sodium). Regular canned tomato sauce and paste (not low-sodium or reduced-sodium). Regular tomato and vegetable juice (not low-sodium or reduced-sodium). Rosita Fire. Olives. Grains Baked goods made with fat, such as croissants, muffins, or some breads. Dry pasta or rice meal packs. Meats  and other proteins Fatty cuts of meat. Ribs. Fried meat. Tomasa Blase. Bologna, salami, and other precooked or cured meats, such as sausages or meat loaves. Fat from the back of a pig (fatback). Bratwurst. Salted nuts and seeds. Canned beans with added salt. Canned or smoked fish. Whole eggs or egg yolks. Chicken or [place] with skin. Dairy Whole or 2% milk, cream, and half-and-half. Whole or full-fat cream cheese. Whole-fat or sweetened yogurt. Full-fat cheese. Nondairy creamers. Whipped toppings. Processed cheese and cheese spreads. Fats and oils Butter. Stick margarine. Lard. Shortening. Ghee. Bacon fat. Tropical oils, such as coconut, palm kernel, or palm oil. Seasonings and condiments Onion salt, garlic salt, seasoned salt, table salt, and sea salt. Worcestershire sauce. Tartar sauce. Barbecue sauce. Teriyaki sauce. Soy sauce, including reduced-sodium. Steak sauce. Canned and packaged gravies. Fish sauce. Oyster sauce. Cocktail sauce. Store-bought horseradish. Ketchup. Mustard. Meat flavorings and tenderizers. Bouillon cubes. Hot sauces. Pre-made or packaged marinades. Pre-made or packaged taco seasonings. Relishes. Regular salad dressings. Other foods Salted popcorn and pretzels. The items listed above may not be a complete list of foods and beverages you should avoid. Contact a dietitian for more information. Where to find more information  National  Heart, Lung, and Blood Institute: PopSteam.is  American Heart Association: www.heart.org  Academy of Nutrition and Dietetics: www.eatright.org  National Kidney Foundation: www.kidney.org Summary  The DASH eating plan is a healthy eating plan that has been shown to reduce high blood pressure (hypertension). It may also reduce your risk for type 2 diabetes, heart disease, and stroke.  When on the DASH eating plan, aim to eat more fresh fruits and vegetables, whole grains, lean proteins, low-fat dairy, and heart-healthy fats.  With the DASH  eating plan, you should limit salt (sodium) intake to 2,300 mg a day. If you have hypertension, you may need to reduce your sodium intake to 1,500 mg a day.  Work with your health care provider or dietitian to adjust your eating plan to your individual calorie needs. This information is not intended to replace advice given to you by your health care provider. Make sure you discuss any questions you have with your health care provider. Document Revised: 02/24/2019 Document Reviewed: 02/24/2019 Elsevier Patient Education  2021 [employer].

## 2020-06-10 NOTE — Telephone Encounter (Signed)
Pharmacy comment: Alternative Requested:PRIOR AUTH NEEDED

## 2020-06-11 NOTE — Telephone Encounter (Signed)
Per CMM - Lubiprostone capsules PA  Not Required [facility] called - needs step therapy   Will continue PA to see what options need to be tried.  Trial of: Linzess May be required  Sent to plan  Key: [id number]

## 2020-06-13 NOTE — Telephone Encounter (Signed)
Denied today Request Reference Number: [id number]. LUBIPROSTONE CAP is denied for not meeting the prior authorization requirement(s)   See below note for what he needs to try first.

## 2020-06-14 ENCOUNTER — Telehealth: Payer: Self-pay

## 2020-06-14 MED ORDER — LINACLOTIDE 72 MCG PO CAPS: 72.0000 ug | ORAL_CAPSULE | Freq: Every day | ORAL | 2 refills | Status: DC

## 2020-06-14 NOTE — Telephone Encounter (Signed)
Spoke with wife and advised [facility] out of office till Tuesday but as soon as this is addressed we will call them back.

## 2020-06-14 NOTE — Telephone Encounter (Signed)
Spouse called--cannot afford lubiprostone (AMITIZA) 8 MCG capsule. Please call in something else.

## 2020-06-14 NOTE — Telephone Encounter (Signed)
I sent Linzess but it looks like on my end that insurance is not going to cover it.

## 2020-06-14 NOTE — Telephone Encounter (Signed)
He was going to try taking MiraLAX twice daily before doing the Amitiza anyway, so it is to see how this goes.

## 2020-06-14 NOTE — Telephone Encounter (Signed)
Pt's wife states he has been doing it twice a day since he saw you 06/07/20 and he isn't really seeing any results.

## 2020-06-15 NOTE — Telephone Encounter (Signed)
I sent Linzess to see if insurance could cover.

## 2020-06-17 NOTE — Telephone Encounter (Signed)
Lmtcb   I sent Linzess to see if insurance could cover. Please let patient know this when he returns call

## 2020-07-01 ENCOUNTER — Other Ambulatory Visit: Payer: Self-pay | Admitting: Family Medicine

## 2020-07-01 DIAGNOSIS — E669 Obesity, unspecified: Secondary | ICD-10-CM

## 2020-07-05 ENCOUNTER — Encounter: Payer: Self-pay | Admitting: Family Medicine

## 2020-07-05 ENCOUNTER — Other Ambulatory Visit: Payer: Self-pay

## 2020-07-05 ENCOUNTER — Ambulatory Visit (INDEPENDENT_AMBULATORY_CARE_PROVIDER_SITE_OTHER): Payer: 59 | Admitting: Family Medicine

## 2020-07-05 VITALS — BP 167/94 | HR 108 | Temp 97.4°F | Ht 73.0 in | Wt 273.0 lb

## 2020-07-05 DIAGNOSIS — E669 Obesity, unspecified: Secondary | ICD-10-CM | POA: Diagnosis not present

## 2020-07-05 DIAGNOSIS — K5904 Chronic idiopathic constipation: Secondary | ICD-10-CM | POA: Diagnosis not present

## 2020-07-05 DIAGNOSIS — I10 Essential (primary) hypertension: Secondary | ICD-10-CM

## 2020-07-05 HISTORY — DX: Essential (primary) hypertension: I10

## 2020-07-05 MED ORDER — LISINOPRIL 10 MG PO TABS: 10.0000 mg | ORAL_TABLET | Freq: Every day | ORAL | 2 refills | Status: DC

## 2020-07-05 MED ORDER — PHENTERMINE HCL 37.5 MG PO TABS: 37.5000 mg | ORAL_TABLET | Freq: Every day | ORAL | 0 refills | Status: DC

## 2020-07-05 NOTE — Patient Instructions (Signed)
DASH Eating Plan DASH stands for Dietary Approaches to Stop Hypertension. The DASH eating plan is a healthy eating plan that has been shown to:  Reduce high blood pressure (hypertension).  Reduce your risk for type 2 diabetes, heart disease, and stroke.  Help with weight loss. What are tips for following this plan? Reading food labels  Check food labels for the amount of salt (sodium) per serving. Choose foods with less than 5 percent of the Daily Value of sodium. Generally, foods with less than 300 milligrams (mg) of sodium per serving fit into this eating plan.  To find whole grains, look for the word "whole" as the first word in the ingredient list. Shopping  Buy products labeled as "low-sodium" or "no salt added."  Buy fresh foods. Avoid canned foods and pre-made or frozen meals. Cooking  Avoid adding salt when cooking. Use salt-free seasonings or herbs instead of table salt or sea salt. Check with your health care provider or pharmacist before using salt substitutes.  Do not fry foods. Cook foods using healthy methods such as baking, boiling, grilling, roasting, and broiling instead.  Cook with heart-healthy oils, such as olive, canola, avocado, soybean, or sunflower oil. Meal planning  Eat a balanced diet that includes: ? 4 or more servings of fruits and 4 or more servings of vegetables each day. Try to fill one-half of your plate with fruits and vegetables. ? 6-8 servings of whole grains each day. ? Less than 6 oz (170 g) of lean meat, poultry, or fish each day. A 3-oz (85-g) serving of meat is about the same size as a deck of cards. One egg equals 1 oz (28 g). ? 2-3 servings of low-fat dairy each day. One serving is 1 cup (237 mL). ? 1 serving of nuts, seeds, or beans 5 times each week. ? 2-3 servings of heart-healthy fats. Healthy fats called omega-3 fatty acids are found in foods such as walnuts, flaxseeds, fortified milks, and eggs. These fats are also found in  cold-water fish, such as sardines, salmon, and mackerel.  Limit how much you eat of: ? Canned or prepackaged foods. ? Food that is high in trans fat, such as some fried foods. ? Food that is high in saturated fat, such as fatty meat. ? Desserts and other sweets, sugary drinks, and other foods with added sugar. ? Full-fat dairy products.  Do not salt foods before eating.  Do not eat more than 4 egg yolks a week.  Try to eat at least 2 vegetarian meals a week.  Eat more home-cooked food and less restaurant, buffet, and fast food.   Lifestyle  When eating at a restaurant, ask that your food be prepared with less salt or no salt, if possible.  If you drink alcohol: ? Limit how much you use to:  0-1 drink a day for women who are not pregnant.  0-2 drinks a day for men. ? Be aware of how much alcohol is in your drink. In the [place] one drink equals one 12 oz bottle of beer (355 mL), one 5 oz glass of wine (148 mL), or one 1 oz glass of hard liquor (44 mL). General information  Avoid eating more than 2,300 mg of salt a day. If you have hypertension, you may need to reduce your sodium intake to 1,500 mg a day.  Work with your health care provider to maintain a healthy body weight or to lose weight. Ask what an ideal weight is for you.    Get at least 30 minutes of exercise that causes your heart to beat faster (aerobic exercise) most days of the week. Activities may include walking, swimming, or biking.  Work with your health care provider or dietitian to adjust your eating plan to your individual calorie needs. What foods should I eat? Fruits All fresh, dried, or frozen fruit. Canned fruit in natural juice (without added sugar). Vegetables Fresh or frozen vegetables (raw, steamed, roasted, or grilled). Low-sodium or reduced-sodium tomato and vegetable juice. Low-sodium or reduced-sodium tomato sauce and tomato paste. Low-sodium or reduced-sodium canned vegetables. Grains Whole-grain  or whole-wheat bread. Whole-grain or whole-wheat pasta. Brown rice. Oatmeal. Quinoa. Bulgur. Whole-grain and low-sodium cereals. Pita bread. Low-fat, low-sodium crackers. Whole-wheat flour tortillas. Meats and other proteins Skinless chicken or turkey. Ground chicken or turkey. Pork with fat trimmed off. Fish and seafood. Egg whites. Dried beans, peas, or lentils. Unsalted nuts, nut butters, and seeds. Unsalted canned beans. Lean cuts of beef with fat trimmed off. Low-sodium, lean precooked or cured meat, such as sausages or meat loaves. Dairy Low-fat (1%) or fat-free (skim) milk. Reduced-fat, low-fat, or fat-free cheeses. Nonfat, low-sodium ricotta or cottage cheese. Low-fat or nonfat yogurt. Low-fat, low-sodium cheese. Fats and oils Soft margarine without trans fats. Vegetable oil. Reduced-fat, low-fat, or light mayonnaise and salad dressings (reduced-sodium). Canola, safflower, olive, avocado, soybean, and sunflower oils. Avocado. Seasonings and condiments Herbs. Spices. Seasoning mixes without salt. Other foods Unsalted popcorn and pretzels. Fat-free sweets. The items listed above may not be a complete list of foods and beverages you can eat. Contact a dietitian for more information. What foods should I avoid? Fruits Canned fruit in a light or heavy syrup. Fried fruit. Fruit in cream or butter sauce. Vegetables Creamed or fried vegetables. Vegetables in a cheese sauce. Regular canned vegetables (not low-sodium or reduced-sodium). Regular canned tomato sauce and paste (not low-sodium or reduced-sodium). Regular tomato and vegetable juice (not low-sodium or reduced-sodium). Pickles. Olives. Grains Baked goods made with fat, such as croissants, muffins, or some breads. Dry pasta or rice meal packs. Meats and other proteins Fatty cuts of meat. Ribs. Fried meat. Bacon. Bologna, salami, and other precooked or cured meats, such as sausages or meat loaves. Fat from the back of a pig (fatback).  Bratwurst. Salted nuts and seeds. Canned beans with added salt. Canned or smoked fish. Whole eggs or egg yolks. Chicken or turkey with skin. Dairy Whole or 2% milk, cream, and half-and-half. Whole or full-fat cream cheese. Whole-fat or sweetened yogurt. Full-fat cheese. Nondairy creamers. Whipped toppings. Processed cheese and cheese spreads. Fats and oils Butter. Stick margarine. Lard. Shortening. Ghee. Bacon fat. Tropical oils, such as coconut, palm kernel, or palm oil. Seasonings and condiments Onion salt, garlic salt, seasoned salt, table salt, and sea salt. Worcestershire sauce. Tartar sauce. Barbecue sauce. Teriyaki sauce. Soy sauce, including reduced-sodium. Steak sauce. Canned and packaged gravies. Fish sauce. Oyster sauce. Cocktail sauce. Store-bought horseradish. Ketchup. Mustard. Meat flavorings and tenderizers. Bouillon cubes. Hot sauces. Pre-made or packaged marinades. Pre-made or packaged taco seasonings. Relishes. Regular salad dressings. Other foods Salted popcorn and pretzels. The items listed above may not be a complete list of foods and beverages you should avoid. Contact a dietitian for more information. Where to find more information  National Heart, Lung, and Blood Institute: www.nhlbi.nih.gov  American Heart Association: www.heart.org  Academy of Nutrition and Dietetics: www.eatright.org  National Kidney Foundation: www.kidney.org Summary  The DASH eating plan is a healthy eating plan that has been shown to reduce high   blood pressure (hypertension). It may also reduce your risk for type 2 diabetes, heart disease, and stroke.  When on the DASH eating plan, aim to eat more fresh fruits and vegetables, whole grains, lean proteins, low-fat dairy, and heart-healthy fats.  With the DASH eating plan, you should limit salt (sodium) intake to 2,300 mg a day. If you have hypertension, you may need to reduce your sodium intake to 1,500 mg a day.  Work with your health care  provider or dietitian to adjust your eating plan to your individual calorie needs. This information is not intended to replace advice given to you by your health care provider. Make sure you discuss any questions you have with your health care provider. Document Revised: 02/24/2019 Document Reviewed: 02/24/2019 Elsevier Patient Education  2021 Elsevier Inc.   

## 2020-07-05 NOTE — Progress Notes (Signed)
Assessment & Plan:  1. Essential hypertension Uncontrolled.  Patient started on lisinopril 10 mg once daily.  Education provided on the DASH diet.  Exercise and diet encouraged. - lisinopril (ZESTRIL) 10 MG tablet; Take 1 tablet (10 mg total) by mouth daily.  Dispense: 30 tablet; Refill: 2  2. Obesity (BMI 30-39.9) Patient is losing weight (9 lbs so far) and not having any side effects from the phentermine.  This is his second prescription. - phentermine (ADIPEX-P) 37.5 MG tablet; Take 1 tablet (37.5 mg total) by mouth daily before breakfast.  Dispense: 30 tablet; Refill: 0  3. Chronic idiopathic constipation Well controlled on current regimen.    Return in about 4 weeks (around 08/02/2020) for weight , HTN.  [name], MSN, APRN, FNP-C [facility]  Subjective:    Patient ID: Austin Curtis, male    DOB: 04-09-1965, 55 y.o.   MRN: [id number]  Patient Care Team: Gwenlyn Fudge, FNP as PCP - General (Family Medicine) Valetta Fuller, Brand Males, NP (Inactive) as Nurse Practitioner (Internal Medicine)   Chief Complaint:  Chief Complaint  Patient presents with  . Hypertension    3 week follow up     HPI: Austin Curtis is a 55 y.o. male presenting on 07/05/2020 for Hypertension (3 week follow up/)  Patient is following up on his blood pressures.  He does not previously have a diagnosis of hypertension, but has been monitoring his blood pressure at home since our last visit.  His systolic ranges 137-182 with 19/20 > 140.  Diastolic ranges 92-109.  Heart rate ranges 81-108 with 9/20 > 100.  He has not been to the gym in the past 2 weeks due to his blood pressure.  He is doing well with the phentermine and has lost 9 pounds since he started it on 06/07/2020.  His constipation is relieved with the Linzess.  New complaints: None  Social history:  Relevant past medical, surgical, family and social history reviewed and updated as indicated. Interim medical history  since our last visit reviewed.  Allergies and medications reviewed and updated.  DATA REVIEWED: CHART IN EPIC  ROS: Negative unless specifically indicated above in HPI.    Current Outpatient Medications:  .  linaclotide (LINZESS) 72 MCG capsule, Take 1 capsule (72 mcg total) by mouth daily before breakfast., Disp: 30 capsule, Rfl: 2 .  phentermine (ADIPEX-P) 37.5 MG tablet, Take 1 tablet (37.5 mg total) by mouth daily before breakfast., Disp: 30 tablet, Rfl: 0   Allergies  Allergen Reactions  . Penicillins Anaphylaxis   Past Medical History:  Diagnosis Date  . Blood in stool   . Hyperuricemia 04/23/2013  . Kidney stones     Past Surgical History:  Procedure Laterality Date  . NO PAST SURGERIES      Social History   Socioeconomic History  . Marital status: Married    Spouse name: Not on file  . Number of children: Not on file  . Years of education: Not on file  . Highest education level: Not on file  Occupational History  . Not on file  Tobacco Use  . Smoking status: Never Smoker  . Smokeless tobacco: Never Used  Vaping Use  . Vaping Use: Never used  Substance and Sexual Activity  . Alcohol use: No    Alcohol/week: 0.0 standard drinks  . Drug use: No  . Sexual activity: Not on file  Other Topics Concern  . Not on file  Social History Narrative  .  Not on file   Social Determinants of Health   Financial Resource Strain: Not on file  Food Insecurity: Not on file  Transportation Needs: Not on file  Physical Activity: Not on file  Stress: Not on file  Social Connections: Not on file  Intimate Partner Violence: Not on file        Objective:    BP (!) 167/94   Pulse (!) 108   Temp (!) 97.4 F (36.3 C) (Temporal)   Ht 6\' 1"  (1.854 m)   Wt 273 lb (123.8 kg)   SpO2 98%   BMI 36.02 kg/m   Wt Readings from Last 3 Encounters:  07/05/20 273 lb (123.8 kg)  06/07/20 284 lb 9.6 oz (129.1 kg)  01/31/19 275 lb 12.8 oz (125.1 kg)    Physical Exam Vitals  reviewed.  Constitutional:      General: He is not in acute distress.    Appearance: Normal appearance. He is morbidly obese. He is not ill-appearing, toxic-appearing or diaphoretic.  HENT:     Head: Normocephalic and atraumatic.  Eyes:     General: No scleral icterus.       Right eye: No discharge.        Left eye: No discharge.     Conjunctiva/sclera: Conjunctivae normal.  Cardiovascular:     Rate and Rhythm: Normal rate and regular rhythm.     Heart sounds: Normal heart sounds. No murmur heard. No friction rub. No gallop.   Pulmonary:     Effort: Pulmonary effort is normal. No respiratory distress.     Breath sounds: Normal breath sounds. No stridor. No wheezing, rhonchi or rales.  Musculoskeletal:        General: Normal range of motion.     Cervical back: Normal range of motion.  Skin:    General: Skin is warm and dry.  Neurological:     Mental Status: He is alert and oriented to person, place, and time. Mental status is at baseline.  Psychiatric:        Mood and Affect: Mood normal.        Behavior: Behavior normal.        Thought Content: Thought content normal.        Judgment: Judgment normal.     No results found for: TSH Lab Results  Component Value Date   WBC 11.4 (A) 04/21/2013   HGB 15.7 04/21/2013   HCT 47.4 04/21/2013   MCV 88.3 04/21/2013   Lab Results  Component Value Date   NA 139 05/02/2014   K 4.4 05/02/2014   CO2 25 05/02/2014   GLUCOSE 87 05/02/2014   BUN 14 05/02/2014   CREATININE 0.98 05/02/2014   BILITOT 0.4 05/02/2014   ALKPHOS 114 05/02/2014   AST 29 05/02/2014   ALT 47 (H) 05/02/2014   PROT 6.7 05/02/2014   ALBUMIN 4.5 05/02/2014   CALCIUM 9.3 05/02/2014   No results found for: CHOL No results found for: HDL No results found for: LDLCALC No results found for: TRIG No results found for: CHOLHDL No results found for: 05/04/2014

## 2020-07-27 ENCOUNTER — Other Ambulatory Visit: Payer: Self-pay | Admitting: Family Medicine

## 2020-07-27 DIAGNOSIS — I10 Essential (primary) hypertension: Secondary | ICD-10-CM

## 2020-08-01 ENCOUNTER — Other Ambulatory Visit: Payer: Self-pay | Admitting: Family Medicine

## 2020-08-01 DIAGNOSIS — E669 Obesity, unspecified: Secondary | ICD-10-CM

## 2020-08-02 ENCOUNTER — Other Ambulatory Visit: Payer: Self-pay

## 2020-08-02 ENCOUNTER — Ambulatory Visit (INDEPENDENT_AMBULATORY_CARE_PROVIDER_SITE_OTHER): Payer: 59 | Admitting: Family Medicine

## 2020-08-02 ENCOUNTER — Encounter: Payer: Self-pay | Admitting: Family Medicine

## 2020-08-02 VITALS — BP 129/83 | HR 91 | Temp 97.8°F | Ht 73.0 in | Wt 262.4 lb

## 2020-08-02 DIAGNOSIS — I10 Essential (primary) hypertension: Secondary | ICD-10-CM

## 2020-08-02 DIAGNOSIS — E669 Obesity, unspecified: Secondary | ICD-10-CM

## 2020-08-02 MED ORDER — LINACLOTIDE 72 MCG PO CAPS: 72.0000 ug | ORAL_CAPSULE | Freq: Every day | ORAL | 1 refills | Status: DC

## 2020-08-02 MED ORDER — LISINOPRIL 10 MG PO TABS: 1.0000 | ORAL_TABLET | Freq: Every day | ORAL | 1 refills | Status: DC

## 2020-08-02 MED ORDER — PHENTERMINE HCL 37.5 MG PO TABS: 37.5000 mg | ORAL_TABLET | Freq: Every day | ORAL | 0 refills | Status: DC

## 2020-08-02 NOTE — Progress Notes (Signed)
Assessment & Plan:  1. Essential hypertension Well controlled on current regimen.  - lisinopril (ZESTRIL) 10 MG tablet; Take 1 tablet (10 mg total) by mouth daily.  Dispense: 90 tablet; Refill: 1  2. Obesity (BMI 30-39.9) Continue diet, exercise, and phentermine. - phentermine (ADIPEX-P) 37.5 MG tablet; Take 1 tablet (37.5 mg total) by mouth daily before breakfast.  Dispense: 30 tablet; Refill: 0   Return in about 4 weeks (around 08/30/2020) for weight.  [name], MSN, APRN, FNP-C [facility]  Subjective:    Patient ID: Austin Curtis, male    DOB: 1965-07-13, 55 y.o.   MRN: [id number]  Patient Care Team: Gwenlyn Fudge, FNP as PCP - General (Family Medicine) Valetta Fuller, Brand Males, NP (Inactive) as Nurse Practitioner (Internal Medicine)   Chief Complaint:  Chief Complaint  Patient presents with  . Hypertension  . Weight Check    4 week re check     HPI: Austin Curtis is a 55 y.o. male presenting on 08/02/2020 for Hypertension and Weight Check (4 week re check )  Hypertension: Patient has been monitoring his blood pressure at home.  Systolic ranges 117-157 with 2/26 > 140.   Obesity: Patient is tolerating phentermine without side effects.  He states he is starting to feel so much better with getting some of the weight off. Starting weight on 06/07/2020: 284 lbs Today's weight: 262 lbs  New complaints: None  Social history:  Relevant past medical, surgical, family and social history reviewed and updated as indicated. Interim medical history since our last visit reviewed.  Allergies and medications reviewed and updated.  DATA REVIEWED: CHART IN EPIC  ROS: Negative unless specifically indicated above in HPI.    Current Outpatient Medications:  .  linaclotide (LINZESS) 72 MCG capsule, Take 1 capsule (72 mcg total) by mouth daily before breakfast., Disp: 30 capsule, Rfl: 2 .  lisinopril (ZESTRIL) 10 MG tablet, TAKE 1 TABLET BY MOUTH EVERY DAY,  Disp: 30 tablet, Rfl: 0 .  phentermine (ADIPEX-P) 37.5 MG tablet, Take 1 tablet (37.5 mg total) by mouth daily before breakfast., Disp: 30 tablet, Rfl: 0   Allergies  Allergen Reactions  . Penicillins Anaphylaxis   Past Medical History:  Diagnosis Date  . Blood in stool   . Constipation   . Essential hypertension 07/05/2020  . Hyperuricemia 04/23/2013  . Kidney stones     Past Surgical History:  Procedure Laterality Date  . NO PAST SURGERIES      Social History   Socioeconomic History  . Marital status: Married    Spouse name: Not on file  . Number of children: Not on file  . Years of education: Not on file  . Highest education level: Not on file  Occupational History  . Not on file  Tobacco Use  . Smoking status: Never Smoker  . Smokeless tobacco: Never Used  Vaping Use  . Vaping Use: Never used  Substance and Sexual Activity  . Alcohol use: No    Alcohol/week: 0.0 standard drinks  . Drug use: No  . Sexual activity: Not on file  Other Topics Concern  . Not on file  Social History Narrative  . Not on file   Social Determinants of Health   Financial Resource Strain: Not on file  Food Insecurity: Not on file  Transportation Needs: Not on file  Physical Activity: Not on file  Stress: Not on file  Social Connections: Not on file  Intimate Partner Violence: Not on  file        Objective:    BP 129/83   Pulse 91   Temp 97.8 F (36.6 C) (Temporal)   Ht 6\' 1"  (1.854 m)   Wt 262 lb 6.4 oz (119 kg)   SpO2 95%   BMI 34.62 kg/m   Wt Readings from Last 3 Encounters:  08/02/20 262 lb 6.4 oz (119 kg)  07/05/20 273 lb (123.8 kg)  06/07/20 284 lb 9.6 oz (129.1 kg)    Physical Exam Vitals reviewed.  Constitutional:      General: He is not in acute distress.    Appearance: Normal appearance. He is obese. He is not ill-appearing, toxic-appearing or diaphoretic.  HENT:     Head: Normocephalic and atraumatic.  Eyes:     General: No scleral icterus.        Right eye: No discharge.        Left eye: No discharge.     Conjunctiva/sclera: Conjunctivae normal.  Cardiovascular:     Rate and Rhythm: Normal rate and regular rhythm.     Heart sounds: Normal heart sounds. No murmur heard. No friction rub. No gallop.   Pulmonary:     Effort: Pulmonary effort is normal. No respiratory distress.     Breath sounds: Normal breath sounds. No stridor. No wheezing, rhonchi or rales.  Musculoskeletal:        General: Normal range of motion.     Cervical back: Normal range of motion.  Skin:    General: Skin is warm and dry.  Neurological:     Mental Status: He is alert and oriented to person, place, and time. Mental status is at baseline.  Psychiatric:        Mood and Affect: Mood normal.        Behavior: Behavior normal.        Thought Content: Thought content normal.        Judgment: Judgment normal.     No results found for: TSH Lab Results  Component Value Date   WBC 11.4 (A) 04/21/2013   HGB 15.7 04/21/2013   HCT 47.4 04/21/2013   MCV 88.3 04/21/2013   Lab Results  Component Value Date   NA 139 05/02/2014   K 4.4 05/02/2014   CO2 25 05/02/2014   GLUCOSE 87 05/02/2014   BUN 14 05/02/2014   CREATININE 0.98 05/02/2014   BILITOT 0.4 05/02/2014   ALKPHOS 114 05/02/2014   AST 29 05/02/2014   ALT 47 (H) 05/02/2014   PROT 6.7 05/02/2014   ALBUMIN 4.5 05/02/2014   CALCIUM 9.3 05/02/2014   No results found for: CHOL No results found for: HDL No results found for: LDLCALC No results found for: TRIG No results found for: CHOLHDL No results found for: 05/04/2014

## 2020-08-14 LAB — BASIC METABOLIC PANEL: Glucose: 105

## 2020-08-14 LAB — LIPID PANEL
Cholesterol: 146 (ref 0–200)
HDL: 33 — AB (ref 35–70)
LDL Cholesterol: 73
Triglycerides: 202 — AB (ref 40–160)

## 2020-09-03 ENCOUNTER — Other Ambulatory Visit: Payer: Self-pay | Admitting: Family Medicine

## 2020-09-03 ENCOUNTER — Ambulatory Visit (INDEPENDENT_AMBULATORY_CARE_PROVIDER_SITE_OTHER): Payer: 59 | Admitting: Family Medicine

## 2020-09-03 ENCOUNTER — Encounter: Payer: Self-pay | Admitting: Family Medicine

## 2020-09-03 ENCOUNTER — Other Ambulatory Visit: Payer: Self-pay

## 2020-09-03 DIAGNOSIS — E669 Obesity, unspecified: Secondary | ICD-10-CM | POA: Diagnosis not present

## 2020-09-03 MED ORDER — PHENTERMINE HCL 37.5 MG PO TABS: 37.5000 mg | ORAL_TABLET | Freq: Every day | ORAL | 0 refills | Status: DC

## 2020-09-03 NOTE — Progress Notes (Signed)
Assessment & Plan:  1. Essential hypertension Well controlled on current regimen.   2. Obesity (BMI 30-39.9) Patient continues to lose weight. Today is rx #4. 9 lbs lost over the past 4 weeks. 31 lbs lost total. Continue diet, exercise, and phentermine. - phentermine (ADIPEX-P) 37.5 MG tablet; Take 1 tablet (37.5 mg total) by mouth daily before breakfast.  Dispense: 30 tablet; Refill: 0   Return in about 4 weeks (around 10/01/2020) for annual physical & weight.  [name], MSN, APRN, FNP-C [facility]  Subjective:    Patient ID: Austin Curtis, male    DOB: [birth year], 55 y.o.   MRN: [id number]  Patient Care Team: Gwenlyn Fudge, FNP as PCP - General (Family Medicine) Valetta Fuller, Brand Males, NP (Inactive) as Nurse Practitioner (Internal Medicine)   Chief Complaint:  Chief Complaint  Patient presents with   Weight Check    4 week     HPI: Austin Curtis is a 55 y.o. male presenting on 09/03/2020 for Weight Check (4 week )  Hypertension: Patient has been monitoring his blood pressure at home.  Systolic ranges 118-138. Diastolic ranges 79-90.   Obesity: Patient is tolerating phentermine without side effects.   Starting weight on 06/07/2020: 284 lbs Weight 4 weeks ago: 262 lbs Today's weight: 253 lbs  New complaints: None  Social history:  Relevant past medical, surgical, family and social history reviewed and updated as indicated. Interim medical history since our last visit reviewed.  Allergies and medications reviewed and updated.  DATA REVIEWED: CHART IN EPIC  ROS: Negative unless specifically indicated above in HPI.    Current Outpatient Medications:    linaclotide (LINZESS) 72 MCG capsule, Take 1 capsule (72 mcg total) by mouth daily before breakfast., Disp: 90 capsule, Rfl: 1   lisinopril (ZESTRIL) 10 MG tablet, Take 1 tablet (10 mg total) by mouth daily., Disp: 90 tablet, Rfl: 1   phentermine (ADIPEX-P) 37.5 MG tablet, Take 1 tablet  (37.5 mg total) by mouth daily before breakfast., Disp: 30 tablet, Rfl: 0   Allergies  Allergen Reactions   Penicillins Anaphylaxis   Past Medical History:  Diagnosis Date   Blood in stool    Constipation    Essential hypertension 07/05/2020   Hyperuricemia 04/23/2013   Kidney stones     Past Surgical History:  Procedure Laterality Date   NO PAST SURGERIES      Social History   Socioeconomic History   Marital status: Married    Spouse name: Not on file   Number of children: Not on file   Years of education: Not on file   Highest education level: Not on file  Occupational History   Not on file  Tobacco Use   Smoking status: Never Smoker   Smokeless tobacco: Never Used  Vaping Use   Vaping Use: Never used  Substance and Sexual Activity   Alcohol use: No    Alcohol/week: 0.0 standard drinks   Drug use: No   Sexual activity: Not on file  Other Topics Concern   Not on file  Social History Narrative   Not on file   Social Determinants of Health   Financial Resource Strain: Not on file  Food Insecurity: Not on file  Transportation Needs: Not on file  Physical Activity: Not on file  Stress: Not on file  Social Connections: Not on file  Intimate Partner Violence: Not on file        Objective:    BP 122/81  Pulse (!) 117   Temp (!) 96.9 F (36.1 C) (Temporal)   Ht 6\' 1"  (1.854 m)   Wt 253 lb (114.8 kg)   SpO2 94%   BMI 33.38 kg/m   Wt Readings from Last 3 Encounters:  09/03/20 253 lb (114.8 kg)  08/02/20 262 lb 6.4 oz (119 kg)  07/05/20 273 lb (123.8 kg)    Physical Exam Vitals reviewed.  Constitutional:      General: He is not in acute distress.    Appearance: Normal appearance. He is obese. He is not ill-appearing, toxic-appearing or diaphoretic.  HENT:     Head: Normocephalic and atraumatic.  Eyes:     General: No scleral icterus.       Right eye: No discharge.        Left eye: No discharge.     Conjunctiva/sclera: Conjunctivae normal.   Cardiovascular:     Rate and Rhythm: Normal rate and regular rhythm.     Heart sounds: Normal heart sounds. No murmur heard.   No friction rub. No gallop.  Pulmonary:     Effort: Pulmonary effort is normal. No respiratory distress.     Breath sounds: Normal breath sounds. No stridor. No wheezing, rhonchi or rales.  Musculoskeletal:        General: Normal range of motion.     Cervical back: Normal range of motion.  Skin:    General: Skin is warm and dry.  Neurological:     Mental Status: He is alert and oriented to person, place, and time. Mental status is at baseline.  Psychiatric:        Mood and Affect: Mood normal.        Behavior: Behavior normal.        Thought Content: Thought content normal.        Judgment: Judgment normal.    No results found for: TSH Lab Results  Component Value Date   WBC 11.4 (A) 04/21/2013   HGB 15.7 04/21/2013   HCT 47.4 04/21/2013   MCV 88.3 04/21/2013   Lab Results  Component Value Date   NA 139 05/02/2014   K 4.4 05/02/2014   CO2 25 05/02/2014   GLUCOSE 87 05/02/2014   BUN 14 05/02/2014   CREATININE 0.98 05/02/2014   BILITOT 0.4 05/02/2014   ALKPHOS 114 05/02/2014   AST 29 05/02/2014   ALT 47 (H) 05/02/2014   PROT 6.7 05/02/2014   ALBUMIN 4.5 05/02/2014   CALCIUM 9.3 05/02/2014   No results found for: CHOL No results found for: HDL No results found for: LDLCALC No results found for: TRIG No results found for: CHOLHDL No results found for: 05/04/2014

## 2020-10-01 ENCOUNTER — Encounter: Payer: 59 | Admitting: Family Medicine

## 2020-10-09 ENCOUNTER — Ambulatory Visit (INDEPENDENT_AMBULATORY_CARE_PROVIDER_SITE_OTHER): Payer: 59 | Admitting: Family Medicine

## 2020-10-09 ENCOUNTER — Other Ambulatory Visit: Payer: Self-pay

## 2020-10-09 ENCOUNTER — Encounter: Payer: Self-pay | Admitting: Family Medicine

## 2020-10-09 VITALS — BP 133/88 | HR 100 | Temp 97.6°F | Ht 73.0 in | Wt 245.4 lb

## 2020-10-09 DIAGNOSIS — K5904 Chronic idiopathic constipation: Secondary | ICD-10-CM

## 2020-10-09 DIAGNOSIS — I10 Essential (primary) hypertension: Secondary | ICD-10-CM

## 2020-10-09 DIAGNOSIS — E669 Obesity, unspecified: Secondary | ICD-10-CM

## 2020-10-09 DIAGNOSIS — Z0000 Encounter for general adult medical examination without abnormal findings: Secondary | ICD-10-CM

## 2020-10-09 MED ORDER — LISINOPRIL 10 MG PO TABS: 10.0000 mg | ORAL_TABLET | Freq: Every day | ORAL | 3 refills | Status: DC

## 2020-10-09 MED ORDER — LINACLOTIDE 72 MCG PO CAPS: 72.0000 ug | ORAL_CAPSULE | Freq: Every day | ORAL | 3 refills | Status: DC

## 2020-10-09 NOTE — Patient Instructions (Signed)
Preventive Care 40-57 Years Old, Male Preventive care refers to lifestyle choices and visits with your health care provider that can promote health and wellness. This includes: A yearly physical exam. This is also called an annual wellness visit. Regular dental and eye exams. Immunizations. Screening for certain conditions. Healthy lifestyle choices, such as: Eating a healthy diet. Getting regular exercise. Not using drugs or products that contain nicotine and tobacco. Limiting alcohol use. What can I expect for my preventive care visit? Physical exam Your health care provider will check your: Height and weight. These may be used to calculate your BMI (body mass index). BMI is a measurement that tells if you are at a healthy weight. Heart rate and blood pressure. Body temperature. Skin for abnormal spots. Counseling Your health care provider may ask you questions about your: Past medical problems. Family's medical history. Alcohol, tobacco, and drug use. Emotional well-being. Home life and relationship well-being. Sexual activity. Diet, exercise, and sleep habits. Work and work environment. Access to firearms. What immunizations do I need?  Vaccines are usually given at various ages, according to a schedule. Your health care provider will recommend vaccines for you based on your age, medicalhistory, and lifestyle or other factors, such as travel or where you work. What tests do I need? Blood tests Lipid and cholesterol levels. These may be checked every 5 years, or more often if you are over 50 years old. Hepatitis C test. Hepatitis B test. Screening Lung cancer screening. You may have this screening every year starting at age 57 if you have a 30-pack-year history of smoking and currently smoke or have quit within the past 15 years. Prostate cancer screening. Recommendations will vary depending on your family history and other risks. Genital exam to check for testicular cancer  or hernias. Colorectal cancer screening. All adults should have this screening starting at age 57 and continuing until age 57. Your health care provider may recommend screening at age 45 if you are at increased risk. You will have tests every 1-10 years, depending on your results and the type of screening test. Diabetes screening. This is done by checking your blood sugar (glucose) after you have not eaten for a while (fasting). You may have this done every 1-3 years. STD (sexually transmitted disease) testing, if you are at risk. Follow these instructions at home: Eating and drinking  Eat a diet that includes fresh fruits and vegetables, whole grains, lean protein, and low-fat dairy products. Take vitamin and mineral supplements as recommended by your health care provider. Do not drink alcohol if your health care provider tells you not to drink. If you drink alcohol: Limit how much you have to 0-2 drinks a day. Be aware of how much alcohol is in your drink. In the [place] one drink equals one 12 oz bottle of beer (355 mL), one 5 oz glass of wine (148 mL), or one 1 oz glass of hard liquor (44 mL).  Lifestyle Take daily care of your teeth and gums. Brush your teeth every morning and night with fluoride toothpaste. Floss one time each day. Stay active. Exercise for at least 30 minutes 5 or more days each week. Do not use any products that contain nicotine or tobacco, such as cigarettes, e-cigarettes, and chewing tobacco. If you need help quitting, ask your health care provider. Do not use drugs. If you are sexually active, practice safe sex. Use a condom or other form of protection to prevent STIs (sexually transmitted infections). If told by   your health care provider, take low-dose aspirin daily starting at age 57. Find healthy ways to cope with stress, such as: Meditation, yoga, or listening to music. Journaling. Talking to a trusted person. Spending time with friends and  family. Safety Always wear your seat belt while driving or riding in a vehicle. Do not drive: If you have been drinking alcohol. Do not ride with someone who has been drinking. When you are tired or distracted. While texting. Wear a helmet and other protective equipment during sports activities. If you have firearms in your house, make sure you follow all gun safety procedures. What's next? Go to your health care provider once a year for an annual wellness visit. Ask your health care provider how often you should have your eyes and teeth checked. Stay up to date on all vaccines. This information is not intended to replace advice given to you by your health care provider. Make sure you discuss any questions you have with your healthcare provider. Document Revised: 12/20/2018 Document Reviewed: 03/17/2018 Elsevier Patient Education  2022 Elsevier Inc.  

## 2020-10-09 NOTE — Progress Notes (Signed)
Assessment & Plan:  1. Well adult exam Preventive health education provided. Declined HIV screening, PSA, and 4th COVID booster at this time. - CBC with Differential/Platelet - CMP14+EGFR - Lipid panel  2. Obesity (BMI 30-39.9) Patient completed 4 months of Adipex and is going to take a break. He has lost an additional 8 lbs over the past month for a total of 39 lbs. He will continue healthy eating and exercise.  - CBC with Differential/Platelet - CMP14+EGFR - Lipid panel  3. Essential hypertension Well controlled on current regimen.  - lisinopril (ZESTRIL) 10 MG tablet; Take 1 tablet (10 mg total) by mouth daily.  Dispense: 90 tablet; Refill: 3 - CBC with Differential/Platelet - CMP14+EGFR - Lipid panel  4. Chronic idiopathic constipation Well controlled on current regimen.  - linaclotide (LINZESS) 72 MCG capsule; Take 1 capsule (72 mcg total) by mouth daily before breakfast.  Dispense: 90 capsule; Refill: 3 - CMP14+EGFR   Follow-up: Return in about 1 year (around 10/09/2021) for annual physical.   Hendricks Limes, MSN, APRN, FNP-C [facility]  Subjective:  Patient ID: Austin Curtis, male    DOB: 12/18/1965  Age: 55 y.o. MRN: [id number]  Patient Care Team: Loman Brooklyn, FNP as PCP - General (Family Medicine) Vaughan Basta, Rona Ravens, NP (Inactive) as Nurse Practitioner (Internal Medicine)   CC:  Chief Complaint  Patient presents with   Annual Exam    Wt check     HPI Austin Curtis presents for his annual physical.  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 He would like to stay off for a few months. He is continuing his healthy eating and exercise.    Review of Systems  Constitutional:  Negative for chills, fever, malaise/fatigue and weight loss.  HENT:  Negative for congestion, ear discharge, ear pain, nosebleeds, sinus pain, sore throat and tinnitus.   Eyes:  Negative for blurred vision, double vision, pain, discharge and redness.  Respiratory:  Negative for cough, shortness of breath and wheezing.   Cardiovascular:  Negative for chest pain, palpitations and leg swelling.  Gastrointestinal:  Negative for abdominal pain, constipation, diarrhea, heartburn, nausea and vomiting.  Genitourinary:  Negative for dysuria, frequency and urgency.       Denies trouble initiating a urine stream, weak stream, split stream, nocturia, and dribbling.   Musculoskeletal:  Negative for myalgias.  Skin:  Negative for rash.  Neurological:  Negative for dizziness, seizures, weakness and headaches.  Psychiatric/Behavioral:  Negative for depression, substance abuse and suicidal ideas. The patient is not nervous/anxious.     Current Outpatient Medications:    linaclotide (LINZESS) 72 MCG capsule, Take 1 capsule (72 mcg total) by mouth daily before breakfast., Disp: 90 capsule, Rfl: 1   lisinopril (ZESTRIL) 10 MG tablet, Take 1 tablet (10 mg total) by mouth daily., Disp: 90 tablet, Rfl: 1   phentermine (ADIPEX-P) 37.5 MG tablet, Take 1  tablet (37.5 mg total) by mouth daily before breakfast., Disp: 30 tablet, Rfl: 0  Allergies  Allergen Reactions   Penicillins Anaphylaxis    Past Medical History:  Diagnosis Date   Blood in stool    Constipation    Essential hypertension 07/05/2020   Hyperuricemia 04/23/2013   Kidney stones     Past Surgical History:  Procedure Laterality Date   NO PAST  SURGERIES      Family History  Problem Relation Age of Onset   Bladder Cancer Father    Early death Sister        MVA at 44 years of age   Colon cancer Neg Hx     Social History   Socioeconomic History   Marital status: Married    Spouse name: Not on file   Number of children: Not on file   Years of education: Not on file   Highest education level: Not on file  Occupational History   Not on file  Tobacco Use   Smoking status: Never   Smokeless tobacco: Never  Vaping Use   Vaping Use: Never used  Substance and Sexual Activity   Alcohol use: No    Alcohol/week: 0.0 standard drinks   Drug use: No   Sexual activity: Not on file  Other Topics Concern   Not on file  Social History Narrative   Not on file   Social Determinants of Health   Financial Resource Strain: Not on file  Food Insecurity: Not on file  Transportation Needs: Not on file  Physical Activity: Not on file  Stress: Not on file  Social Connections: Not on file  Intimate Partner Violence: Not on file      Objective:    BP 134/90   Pulse 100   Temp 97.6 F (36.4 C) (Temporal)   Ht 6' 1"  (1.854 m)   Wt 245 lb 6.4 oz (111.3 kg)   SpO2 98%   BMI 32.38 kg/m   Wt Readings from Last 3 Encounters:  10/09/20 245 lb 6.4 oz (111.3 kg)  09/03/20 253 lb (114.8 kg)  08/02/20 262 lb 6.4 oz (119 kg)    Physical Exam Vitals reviewed.  Constitutional:      General: He is not in acute distress.    Appearance: Normal appearance. He is obese. He is not ill-appearing, toxic-appearing or diaphoretic.  HENT:     Head: Normocephalic and atraumatic.     Right Ear: Tympanic membrane, ear canal and external ear normal. There is no impacted cerumen.     Left Ear: Tympanic membrane, ear canal and external ear normal. There is no impacted cerumen.     Nose: Nose normal. No congestion or rhinorrhea.     Mouth/Throat:     Mouth: Mucous membranes are moist.     Pharynx: Oropharynx is clear. No oropharyngeal exudate  or posterior oropharyngeal erythema.  Eyes:     General: No scleral icterus.       Right eye: No discharge.        Left eye: No discharge.     Conjunctiva/sclera: Conjunctivae normal.     Pupils: Pupils are equal, round, and reactive to light.  Neck:     Vascular: No carotid bruit.  Cardiovascular:     Rate and Rhythm: Normal rate and regular rhythm.     Heart sounds: Normal heart sounds. No murmur heard.   No friction rub. No gallop.  Pulmonary:     Effort: Pulmonary effort is normal. No respiratory  distress.     Breath sounds: Normal breath sounds. No stridor. No wheezing, rhonchi or rales.  Abdominal:     General: Abdomen is flat. Bowel sounds are normal. There is no distension.     Palpations: Abdomen is soft. There is no hepatomegaly, splenomegaly or mass.     Tenderness: There is no abdominal tenderness. There is no guarding or rebound.     Hernia: No hernia is present.  Musculoskeletal:        General: Normal range of motion.     Cervical back: Normal range of motion and neck supple. No rigidity. No muscular tenderness.     Right lower leg: No edema.     Left lower leg: No edema.  Lymphadenopathy:     Cervical: No cervical adenopathy.  Skin:    General: Skin is warm and dry.     Capillary Refill: Capillary refill takes less than 2 seconds.  Neurological:     General: No focal deficit present.     Mental Status: He is alert and oriented to person, place, and time. Mental status is at baseline.  Psychiatric:        Mood and Affect: Mood normal.        Behavior: Behavior normal.        Thought Content: Thought content normal.        Judgment: Judgment normal.    No results found for: TSH Lab Results  Component Value Date   WBC 11.4 (A) 04/21/2013   HGB 15.7 04/21/2013   HCT 47.4 04/21/2013   MCV 88.3 04/21/2013   Lab Results  Component Value Date   NA 139 05/02/2014   K 4.4 05/02/2014   CO2 25 05/02/2014   GLUCOSE 87 05/02/2014   BUN 14 05/02/2014    CREATININE 0.98 05/02/2014   BILITOT 0.4 05/02/2014   ALKPHOS 114 05/02/2014   AST 29 05/02/2014   ALT 47 (H) 05/02/2014   PROT 6.7 05/02/2014   ALBUMIN 4.5 05/02/2014   CALCIUM 9.3 05/02/2014   Lab Results  Component Value Date   CHOL 146 08/14/2020   Lab Results  Component Value Date   HDL 33 (A) 08/14/2020   Lab Results  Component Value Date   LDLCALC 73 08/14/2020   Lab Results  Component Value Date   TRIG 202 (A) 08/14/2020   No results found for: CHOLHDL No results found for: HGBA1C

## 2020-10-10 ENCOUNTER — Encounter: Payer: Self-pay | Admitting: Family Medicine

## 2020-10-10 LAB — LIPID PANEL
Chol/HDL Ratio: 5.1 ratio — ABNORMAL HIGH (ref 0.0–5.0)
Cholesterol, Total: 153 mg/dL (ref 100–199)
HDL: 30 mg/dL — ABNORMAL LOW (ref >39)
LDL Chol Calc (NIH): 72 mg/dL (ref 0–99)
Triglycerides: 318 mg/dL — ABNORMAL HIGH (ref 0–149)
VLDL Cholesterol Cal: 51 mg/dL — ABNORMAL HIGH (ref 5–40)

## 2020-10-10 LAB — CBC WITH DIFFERENTIAL/PLATELET
Basophils Absolute: 0.1 10*3/uL (ref 0.0–0.2)
Basos: 1 %
EOS (ABSOLUTE): 0.1 10*3/uL (ref 0.0–0.4)
Eos: 2 %
Hematocrit: 45.6 % (ref 37.5–51.0)
Hemoglobin: 15.2 g/dL (ref 13.0–17.7)
Immature Grans (Abs): 0 10*3/uL (ref 0.0–0.1)
Immature Granulocytes: 0 %
Lymphocytes Absolute: 1.8 10*3/uL (ref 0.7–3.1)
Lymphs: 23 %
MCH: 29 pg (ref 26.6–33.0)
MCHC: 33.3 g/dL (ref 31.5–35.7)
MCV: 87 fL (ref 79–97)
Monocytes Absolute: 0.7 10*3/uL (ref 0.1–0.9)
Monocytes: 9 %
Neutrophils Absolute: 5.1 10*3/uL (ref 1.4–7.0)
Neutrophils: 65 %
Platelets: 226 10*3/uL (ref 150–450)
RBC: 5.24 x10E6/uL (ref 4.14–5.80)
RDW: 12.2 % (ref 11.6–15.4)
WBC: 7.9 10*3/uL (ref 3.4–10.8)

## 2020-10-10 LAB — CMP14+EGFR
ALT: 37 IU/L (ref 0–44)
AST: 29 IU/L (ref 0–40)
Albumin/Globulin Ratio: 2 (ref 1.2–2.2)
Albumin: 4.5 g/dL (ref 3.8–4.9)
Alkaline Phosphatase: 123 IU/L — ABNORMAL HIGH (ref 44–121)
BUN/Creatinine Ratio: 11 (ref 9–20)
BUN: 13 mg/dL (ref 6–24)
Bilirubin Total: 0.3 mg/dL (ref 0.0–1.2)
CO2: 24 mmol/L (ref 20–29)
Calcium: 9.2 mg/dL (ref 8.7–10.2)
Chloride: 101 mmol/L (ref 96–106)
Creatinine, Ser: 1.22 mg/dL (ref 0.76–1.27)
Globulin, Total: 2.2 g/dL (ref 1.5–4.5)
Glucose: 81 mg/dL (ref 65–99)
Potassium: 4.7 mmol/L (ref 3.5–5.2)
Sodium: 142 mmol/L (ref 134–144)
Total Protein: 6.7 g/dL (ref 6.0–8.5)
eGFR: 70 mL/min/{1.73_m2} (ref >59)

## 2021-02-04 ENCOUNTER — Ambulatory Visit (INDEPENDENT_AMBULATORY_CARE_PROVIDER_SITE_OTHER): Payer: 59 | Admitting: Family Medicine

## 2021-02-04 ENCOUNTER — Other Ambulatory Visit: Payer: Self-pay

## 2021-02-04 ENCOUNTER — Encounter: Payer: Self-pay | Admitting: Family Medicine

## 2021-02-04 VITALS — BP 138/89 | HR 84 | Temp 97.3°F | Ht 73.0 in | Wt 238.8 lb

## 2021-02-04 DIAGNOSIS — K5904 Chronic idiopathic constipation: Secondary | ICD-10-CM | POA: Diagnosis not present

## 2021-02-04 DIAGNOSIS — I10 Essential (primary) hypertension: Secondary | ICD-10-CM | POA: Diagnosis not present

## 2021-02-04 DIAGNOSIS — E669 Obesity, unspecified: Secondary | ICD-10-CM | POA: Diagnosis not present

## 2021-02-04 MED ORDER — LISINOPRIL 10 MG PO TABS: 10.0000 mg | ORAL_TABLET | Freq: Every day | ORAL | 2 refills | Status: DC

## 2021-02-04 MED ORDER — PHENTERMINE HCL 37.5 MG PO TABS
37.5000 mg | ORAL_TABLET | Freq: Every day | ORAL | 0 refills | Status: DC
Start: 2021-02-04 — End: 2021-03-05

## 2021-02-04 MED ORDER — LINACLOTIDE 72 MCG PO CAPS: 72.0000 ug | ORAL_CAPSULE | Freq: Every day | ORAL | 2 refills | Status: DC

## 2021-02-04 NOTE — Progress Notes (Signed)
Assessment & Plan:  1. Essential hypertension Well controlled on current regimen.  - lisinopril (ZESTRIL) 10 MG tablet; Take 1 tablet (10 mg total) by mouth daily.  Dispense: 90 tablet; Refill: 2  2. Chronic idiopathic constipation Well controlled on current regimen.  - linaclotide (LINZESS) 72 MCG capsule; Take 1 capsule (72 mcg total) by mouth daily before breakfast.  Dispense: 90 capsule; Refill: 2  3. Obesity (BMI 30-39.9) Restarting Adipex - prescription #1 today. Today's starting weight: 238 lbs - phentermine (ADIPEX-P) 37.5 MG tablet; Take 1 tablet (37.5 mg total) by mouth daily before breakfast.  Dispense: 30 tablet; Refill: 0   Return in about 4 weeks (around 03/04/2021) for Weight.  Hendricks Limes, MSN, APRN, FNP-C [facility]  Subjective:    Patient ID: Austin Curtis, male    DOB: [birth year], 55 y.o.   MRN: [id number]  Patient Care Team: Loman Brooklyn, FNP as PCP - General (Family Medicine) Vaughan Basta, Rona Ravens, NP (Inactive) as Nurse Practitioner (Internal Medicine)   Chief Complaint:  Chief Complaint  Patient presents with   Hypertension    4 month follow up of chronic medical conditions    weight loss    Wants to get back on medication     HPI: Austin Curtis is a 55 y.o. male presenting on 02/04/2021 for Hypertension (4 month follow up of chronic medical conditions ) and weight loss (Wants to get back on medication )  Hypertension: Patient has been monitoring his blood pressure at home once weekly and reports he always gets good readings.  Constipation: doing well with Linzess.  Obesity: patient previously took phentermine March-June at which time he did not want to continue as he was doing well on his own without the medication. He would like to resume today due to trouble with his eating the past 2 months. He eats small more frequent meals, but feels he is eating them too frequently. He is exercising.  New  complaints: None   Social history:  Relevant past medical, surgical, family and social history reviewed and updated as indicated. Interim medical history since our last visit reviewed.  Allergies and medications reviewed and updated.  DATA REVIEWED: CHART IN EPIC  ROS: Negative unless specifically indicated above in HPI.    Current Outpatient Medications:    linaclotide (LINZESS) 72 MCG capsule, Take 1 capsule (72 mcg total) by mouth daily before breakfast., Disp: 90 capsule, Rfl: 3   lisinopril (ZESTRIL) 10 MG tablet, Take 1 tablet (10 mg total) by mouth daily., Disp: 90 tablet, Rfl: 3   Allergies  Allergen Reactions   Penicillins Anaphylaxis   Past Medical History:  Diagnosis Date   Blood in stool    Constipation    Essential hypertension 07/05/2020   Hyperuricemia 04/23/2013   Kidney stones     Past Surgical History:  Procedure Laterality Date   NO PAST SURGERIES      Social History   Socioeconomic History   Marital status: Married    Spouse name: Not on file   Number of children: Not on file   Years of education: Not on file   Highest education level: Not on file  Occupational History   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[employer] Use   Smoking status: Never   Smokeless tobacco: Never  Vaping Use   Vaping Use: Never used  Substance and Sexual Activity   Alcohol use: No    Alcohol/week: 0.0 standard drinks   Drug  use: No   Sexual activity: Not on file  Other Topics Concern   Not on file  Social History Narrative   Not on file   Social Determinants of Health   Financial Resource Strain: Not on file  Food Insecurity: Not on file  Transportation Needs: Not on file  Physical Activity: Not on file  Stress: Not on file  Social Connections: Not on file  Intimate Partner Violence: Not on file        Objective:    BP 138/89   Pulse 84   Temp (!) 97.3 F (36.3 C) (Temporal)   Ht 6' 1"  (1.854 m)   Wt 238 lb 12.8 oz (108.3 kg)    SpO2 97%   BMI 31.51 kg/m   Wt Readings from Last 3 Encounters:  02/04/21 238 lb 12.8 oz (108.3 kg)  10/09/20 245 lb 6.4 oz (111.3 kg)  09/03/20 253 lb (114.8 kg)    Physical Exam Vitals reviewed.  Constitutional:      General: He is not in acute distress.    Appearance: Normal appearance. He is obese. He is not ill-appearing, toxic-appearing or diaphoretic.  HENT:     Head: Normocephalic and atraumatic.  Eyes:     General: No scleral icterus.       Right eye: No discharge.        Left eye: No discharge.     Conjunctiva/sclera: Conjunctivae normal.  Cardiovascular:     Rate and Rhythm: Normal rate and regular rhythm.     Heart sounds: Normal heart sounds. No murmur heard.   No friction rub. No gallop.  Pulmonary:     Effort: Pulmonary effort is normal. No respiratory distress.     Breath sounds: Normal breath sounds. No stridor. No wheezing, rhonchi or rales.  Musculoskeletal:        General: Normal range of motion.     Cervical back: Normal range of motion.  Skin:    General: Skin is warm and dry.  Neurological:     Mental Status: He is alert and oriented to person, place, and time. Mental status is at baseline.  Psychiatric:        Mood and Affect: Mood normal.        Behavior: Behavior normal.        Thought Content: Thought content normal.        Judgment: Judgment normal.    No results found for: TSH Lab Results  Component Value Date   WBC 7.9 10/09/2020   HGB 15.2 10/09/2020   HCT 45.6 10/09/2020   MCV 87 10/09/2020   PLT 226 10/09/2020   Lab Results  Component Value Date   NA 142 10/09/2020   K 4.7 10/09/2020   CO2 24 10/09/2020   GLUCOSE 81 10/09/2020   BUN 13 10/09/2020   CREATININE 1.22 10/09/2020   BILITOT 0.3 10/09/2020   ALKPHOS 123 (H) 10/09/2020   AST 29 10/09/2020   ALT 37 10/09/2020   PROT 6.7 10/09/2020   ALBUMIN 4.5 10/09/2020   CALCIUM 9.2 10/09/2020   EGFR 70 10/09/2020   Lab Results  Component Value Date   CHOL 153  10/09/2020   Lab Results  Component Value Date   HDL 30 (L) 10/09/2020   Lab Results  Component Value Date   LDLCALC 72 10/09/2020   Lab Results  Component Value Date   TRIG 318 (H) 10/09/2020   Lab Results  Component Value Date   CHOLHDL 5.1 (H) 10/09/2020  No results found for: HGBA1C

## 2021-03-05 ENCOUNTER — Ambulatory Visit (INDEPENDENT_AMBULATORY_CARE_PROVIDER_SITE_OTHER): Payer: 59 | Admitting: Family Medicine

## 2021-03-05 ENCOUNTER — Encounter: Payer: Self-pay | Admitting: Family Medicine

## 2021-03-05 DIAGNOSIS — E669 Obesity, unspecified: Secondary | ICD-10-CM | POA: Diagnosis not present

## 2021-03-05 MED ORDER — PHENTERMINE HCL 37.5 MG PO TABS: 37.5000 mg | ORAL_TABLET | Freq: Every day | ORAL | 0 refills | Status: DC

## 2021-03-05 NOTE — Progress Notes (Signed)
Assessment & Plan:  1. Obesity (BMI 30-39.9) Prescription #2 provided today. Continue healthy eating and exercise. Starting weight: 238 lbs Today's weight: 232 lbs Total weight lost: 6 lbs   Return in 4 weeks (on 04/02/2021) for Weight.  Lucile Crater, NP Student  I personally was present during the history, physical exam, and medical decision-making activities of this service and have verified that the service and findings are accurately documented in the nurse practitioner student's note.  Hendricks Limes, MSN, APRN, FNP-C [facility]  Subjective:    Patient ID: Austin Curtis, male    DOB: 06-19-1965, 55 y.o.   MRN: [id number]  Patient Care Team: Loman Brooklyn, FNP as PCP - General (Family Medicine) Vaughan Basta, Rona Ravens, NP (Inactive) as Nurse Practitioner (Internal Medicine)   Chief Complaint:  Chief Complaint  Patient presents with   Weight Check    4week follow up     HPI: Austin Curtis is a 55 y.o. male presenting on 03/05/2021 for Weight Check (4week follow up )  Obesity: patient was restarted on phentermine at his last visit on 02/04/21 due to trouble with eating too frequently. He was previously doing very well with smaller meals. He has been exercising to help with his weight.  New complaints: None   Social history:  Relevant past medical, surgical, family and social history reviewed and updated as indicated. Interim medical history since our last visit reviewed.  Allergies and medications reviewed and updated.  DATA REVIEWED: CHART IN EPIC  ROS: Negative unless specifically indicated above in HPI.    Current Outpatient Medications:    linaclotide (LINZESS) 72 MCG capsule, Take 1 capsule (72 mcg total) by mouth daily before breakfast., Disp: 90 capsule, Rfl: 2   lisinopril (ZESTRIL) 10 MG tablet, Take 1 tablet (10 mg total) by mouth daily., Disp: 90 tablet, Rfl: 2   phentermine (ADIPEX-P) 37.5 MG tablet, Take 1 tablet (37.5 mg  total) by mouth daily before breakfast., Disp: 30 tablet, Rfl: 0   Allergies  Allergen Reactions   Penicillins Anaphylaxis   Past Medical History:  Diagnosis Date   Blood in stool    Constipation    Essential hypertension 07/05/2020   Hyperuricemia 04/23/2013   Kidney stones     Past Surgical History:  Procedure Laterality Date   NO PAST SURGERIES      Social History   Socioeconomic History   Marital status: Married    Spouse name: Not on file   Number of children: Not on file   Years of education: Not on file   Highest education level: Not on file  Occupational History   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[employer] Use   Smoking status: Never   Smokeless tobacco: Never  Vaping Use   Vaping Use: Never used  Substance and Sexual Activity   Alcohol use: No    Alcohol/week: 0.0 standard drinks   Drug use: No   Sexual activity: Not on file  Other Topics Concern   Not on file  Social History Narrative   Not on file   Social Determinants of Health   Financial Resource Strain: Not on file  Food Insecurity: Not on file  Transportation Needs: Not on file  Physical Activity: Not on file  Stress: Not on file  Social Connections: Not on file  Intimate Partner Violence: Not on file        Objective:    BP 130/83   Pulse 98  Temp (!) 97.1 F (36.2 C) (Temporal)   Ht _0  (1.854 m)   Wt 105.2 kg   SpO2 97%   BMI 30.61 kg/m   Wt Readings from Last 3 Encounters:  03/05/21 105.2 kg  02/04/21 108.3 kg  10/09/20 111.3 kg    Physical Exam Vitals reviewed.  Constitutional:      General: He is not in acute distress.    Appearance: Normal appearance. He is obese. He is not ill-appearing, toxic-appearing or diaphoretic.  HENT:     Head: Normocephalic and atraumatic.  Eyes:     General: No scleral icterus.       Right eye: No discharge.        Left eye: No discharge.     Conjunctiva/sclera: Conjunctivae normal.  Cardiovascular:     Rate and  Rhythm: Normal rate and regular rhythm.     Heart sounds: Normal heart sounds. No murmur heard.   No friction rub. No gallop.  Pulmonary:     Effort: Pulmonary effort is normal. No respiratory distress.     Breath sounds: Normal breath sounds. No stridor. No wheezing, rhonchi or rales.  Musculoskeletal:        General: Normal range of motion.     Cervical back: Normal range of motion.  Skin:    General: Skin is warm and dry.  Neurological:     Mental Status: He is alert and oriented to person, place, and time. Mental status is at baseline.  Psychiatric:        Mood and Affect: Mood normal.        Behavior: Behavior normal.        Thought Content: Thought content normal.        Judgment: Judgment normal.    No results found for: TSH Lab Results  Component Value Date   WBC 7.9 10/09/2020   HGB 15.2 10/09/2020   HCT 45.6 10/09/2020   MCV 87 10/09/2020   PLT 226 10/09/2020   Lab Results  Component Value Date   NA 142 10/09/2020   K 4.7 10/09/2020   CO2 24 10/09/2020   GLUCOSE 81 10/09/2020   BUN 13 10/09/2020   CREATININE 1.22 10/09/2020   BILITOT 0.3 10/09/2020   ALKPHOS 123 (H) 10/09/2020   AST 29 10/09/2020   ALT 37 10/09/2020   PROT 6.7 10/09/2020   ALBUMIN 4.5 10/09/2020   CALCIUM 9.2 10/09/2020   EGFR 70 10/09/2020   Lab Results  Component Value Date   CHOL 153 10/09/2020   Lab Results  Component Value Date   HDL 30 (L) 10/09/2020   Lab Results  Component Value Date   LDLCALC 72 10/09/2020   Lab Results  Component Value Date   TRIG 318 (H) 10/09/2020   Lab Results  Component Value Date   CHOLHDL 5.1 (H) 10/09/2020   No results found for: HGBA1C

## 2021-03-25 ENCOUNTER — Ambulatory Visit: Payer: 59 | Admitting: Family Medicine

## 2021-04-07 ENCOUNTER — Encounter: Payer: Self-pay | Admitting: Family Medicine

## 2021-04-07 DIAGNOSIS — I10 Essential (primary) hypertension: Secondary | ICD-10-CM

## 2021-04-08 ENCOUNTER — Telehealth: Payer: Self-pay | Admitting: Family Medicine

## 2021-04-08 MED ORDER — LOSARTAN POTASSIUM 50 MG PO TABS: 50.0000 mg | ORAL_TABLET | Freq: Every day | ORAL | 2 refills | Status: DC

## 2021-04-08 NOTE — Telephone Encounter (Signed)
Detailed mychart message sent to provider today. Will address in Mychart

## 2021-04-16 ENCOUNTER — Encounter: Payer: Self-pay | Admitting: Family Medicine

## 2021-04-16 ENCOUNTER — Ambulatory Visit: Payer: 59 | Admitting: Family Medicine

## 2021-05-13 LAB — LIPID PANEL
Cholesterol: 134 (ref 0–200)
HDL: 37 (ref 35–70)
LDL Cholesterol: 73
Triglycerides: 120 (ref 40–160)

## 2021-05-13 LAB — BASIC METABOLIC PANEL: Glucose: 91

## 2021-05-27 ENCOUNTER — Telehealth: Payer: Self-pay | Admitting: Family Medicine

## 2021-05-27 ENCOUNTER — Encounter: Payer: Self-pay | Admitting: Family Medicine

## 2021-05-27 ENCOUNTER — Ambulatory Visit (INDEPENDENT_AMBULATORY_CARE_PROVIDER_SITE_OTHER): Payer: 59 | Admitting: Family Medicine

## 2021-05-27 DIAGNOSIS — E669 Obesity, unspecified: Secondary | ICD-10-CM | POA: Diagnosis not present

## 2021-05-27 MED ORDER — WEGOVY 0.25 MG/0.5ML ~~LOC~~ SOAJ: 0.2500 mg | SUBCUTANEOUS | 0 refills | Status: DC

## 2021-05-27 NOTE — Progress Notes (Signed)
Assessment & Plan:  1. Obesity (BMI 30-39.9) Starting IHWTUU. - Semaglutide-Weight Management (WEGOVY) 0.25 MG/0.5ML SOAJ; Inject 0.25 mg into the skin once a week.  Dispense: 2 mL; Refill: 0   Return in about 4 weeks (around 06/24/2021) for weight.  Hendricks Limes, MSN, APRN, FNP-C [facility]  Subjective:    Patient ID: Austin Curtis, male    DOB: 13-May-1965, 56 y.o.   MRN: [id number]  Patient Care Team: Loman Brooklyn, FNP as PCP - General (Family Medicine) Vaughan Basta, Rona Ravens, NP (Inactive) as Nurse Practitioner (Internal Medicine)   Chief Complaint:  Chief Complaint  Patient presents with   Weight Check    HPI: Austin Curtis is a 56 y.o. male presenting on 05/27/2021 for Weight Check  Obesity: patient was restarted on phentermine in November due to trouble with eating too frequently. He was previously doing very well with smaller meals. He has been exercising to help with his weight. He has been off the phentermine since the beginning of the year (2 months now).   New complaints: None   Social history:  Relevant past medical, surgical, family and social history reviewed and updated as indicated. Interim medical history since our last visit reviewed.  Allergies and medications reviewed and updated.  DATA REVIEWED: CHART IN EPIC  ROS: Negative unless specifically indicated above in HPI.    Current Outpatient Medications:    linaclotide (LINZESS) 72 MCG capsule, Take 1 capsule (72 mcg total) by mouth daily before breakfast., Disp: 90 capsule, Rfl: 2   losartan (COZAAR) 50 MG tablet, Take 1 tablet (50 mg total) by mouth daily., Disp: 30 tablet, Rfl: 2   phentermine (ADIPEX-P) 37.5 MG tablet, Take 1 tablet (37.5 mg total) by mouth daily before breakfast., Disp: 30 tablet, Rfl: 0   Allergies  Allergen Reactions   Penicillins Anaphylaxis   Lisinopril Cough   Past Medical History:  Diagnosis Date   Blood in stool    Constipation     Essential hypertension 07/05/2020   Hyperuricemia 04/23/2013   Kidney stones     Past Surgical History:  Procedure Laterality Date   NO PAST SURGERIES      Social History   Socioeconomic History   Marital status: Married    Spouse name: Not on file   Number of children: Not on file   Years of education: Not on file   Highest education level: Not on file  Occupational History   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   Smoking status: Never   Smokeless tobacco: Never  Vaping Use   Vaping Use: Never used  Substance and Sexual Activity   Alcohol use: No    Alcohol/week: 0.0 standard drinks   Drug use: No   Sexual activity: Not on file  Other Topics Concern   Not on file  Social History Narrative   Not on file   Social Determinants of Health   Financial Resource Strain: Not on file  Food Insecurity: Not on file  Transportation Needs: Not on file  Physical Activity: Not on file  Stress: Not on file  Social Connections: Not on file  Intimate Partner Violence: Not on file        Objective:    BP 138/90    Pulse (!) 103    Temp 97.7 F (36.5 C) (Temporal)    Ht _0  (1.854 m)    Wt 241 lb 9.6 oz (109.6 kg)    SpO2  95%    BMI 31.88 kg/m   Wt Readings from Last 3 Encounters:  05/27/21 241 lb 9.6 oz (109.6 kg)  03/05/21 232 lb (105.2 kg)  02/04/21 238 lb 12.8 oz (108.3 kg)    Physical Exam Vitals reviewed.  Constitutional:      General: He is not in acute distress.    Appearance: Normal appearance. He is obese. He is not ill-appearing, toxic-appearing or diaphoretic.  HENT:     Head: Normocephalic and atraumatic.  Eyes:     General: No scleral icterus.       Right eye: No discharge.        Left eye: No discharge.     Conjunctiva/sclera: Conjunctivae normal.  Cardiovascular:     Rate and Rhythm: Normal rate and regular rhythm.     Heart sounds: Normal heart sounds. No murmur heard.   No friction rub. No gallop.  Pulmonary:      Effort: Pulmonary effort is normal. No respiratory distress.     Breath sounds: Normal breath sounds. No stridor. No wheezing, rhonchi or rales.  Musculoskeletal:        General: Normal range of motion.     Cervical back: Normal range of motion.  Skin:    General: Skin is warm and dry.  Neurological:     Mental Status: He is alert and oriented to person, place, and time. Mental status is at baseline.  Psychiatric:        Mood and Affect: Mood normal.        Behavior: Behavior normal.        Thought Content: Thought content normal.        Judgment: Judgment normal.    No results found for: TSH Lab Results  Component Value Date   WBC 7.9 10/09/2020   HGB 15.2 10/09/2020   HCT 45.6 10/09/2020   MCV 87 10/09/2020   PLT 226 10/09/2020   Lab Results  Component Value Date   NA 142 10/09/2020   K 4.7 10/09/2020   CO2 24 10/09/2020   GLUCOSE 81 10/09/2020   BUN 13 10/09/2020   CREATININE 1.22 10/09/2020   BILITOT 0.3 10/09/2020   ALKPHOS 123 (H) 10/09/2020   AST 29 10/09/2020   ALT 37 10/09/2020   PROT 6.7 10/09/2020   ALBUMIN 4.5 10/09/2020   CALCIUM 9.2 10/09/2020   EGFR 70 10/09/2020   Lab Results  Component Value Date   CHOL 153 10/09/2020   Lab Results  Component Value Date   HDL 30 (L) 10/09/2020   Lab Results  Component Value Date   LDLCALC 72 10/09/2020   Lab Results  Component Value Date   TRIG 318 (H) 10/09/2020   Lab Results  Component Value Date   CHOLHDL 5.1 (H) 10/09/2020   No results found for: HGBA1C

## 2021-05-29 ENCOUNTER — Encounter: Payer: Self-pay | Admitting: Family Medicine

## 2021-05-29 DIAGNOSIS — E669 Obesity, unspecified: Secondary | ICD-10-CM

## 2021-05-29 NOTE — Telephone Encounter (Signed)
Based off of the PA for wegovy his insurance does not cover weight loss medication

## 2021-05-29 NOTE — Telephone Encounter (Signed)
Does it give [place] the clinical requirements?

## 2021-05-29 NOTE — Telephone Encounter (Signed)
(  Key: [id number]) NFAOZH 0.25MG /0.5ML auto-injectors  PA in process/ww

## 2021-05-29 NOTE — Telephone Encounter (Signed)
On behalf of [facility], [facility] is responsible for reviewing pharmacy services provided to [facility]. We received a request from your prescriber for coverage of Wegovy Inj 0.25mg . We have declined to provide benefits, in whole or in part, for the requested treatment or service coverage for [facility]. Why was my request denied? This request was denied because you did not meet the following clinical requirements: The requested medication and/or diagnosis are not a covered benefit and excluded from coverage in accordance with the terms and conditions of your plan benefit. Therefore, the request has been administratively denied. The requested medication and/or diagnosis are not a covered benefit and are excluded from coverage in accordance with the terms and conditions of your plan benefit. Therefore, this request has been administratively denied

## 2021-05-30 MED ORDER — PHENTERMINE HCL 37.5 MG PO TABS: 37.5000 mg | ORAL_TABLET | Freq: Every day | ORAL | 0 refills | Status: DC

## 2021-06-25 ENCOUNTER — Encounter: Payer: Self-pay | Admitting: Family Medicine

## 2021-06-25 ENCOUNTER — Ambulatory Visit (INDEPENDENT_AMBULATORY_CARE_PROVIDER_SITE_OTHER): Payer: 59 | Admitting: Family Medicine

## 2021-06-25 ENCOUNTER — Ambulatory Visit: Payer: 59 | Admitting: Family Medicine

## 2021-06-25 DIAGNOSIS — E669 Obesity, unspecified: Secondary | ICD-10-CM | POA: Diagnosis not present

## 2021-06-25 MED ORDER — PHENTERMINE HCL 37.5 MG PO TABS: 37.5000 mg | ORAL_TABLET | Freq: Every day | ORAL | 0 refills | Status: DC

## 2021-06-25 MED ORDER — TOPIRAMATE 25 MG PO TABS: 25.0000 mg | ORAL_TABLET | Freq: Every day | ORAL | 2 refills | Status: DC

## 2021-06-25 NOTE — Progress Notes (Signed)
? ?Assessment & Plan:  ?1. Obesity (BMI 30-39.9) ?Starting weight: 241 lbs ?Today's weight: 233 lbs ?Total weight lost: 8 lbs ?Adipex prescription #2 today. Started Topamax to help curb his appetite. ?- phentermine (ADIPEX-P) 37.5 MG tablet; Take 1 tablet (37.5 mg total) by mouth daily before breakfast.  Dispense: 30 tablet; Refill: 0 ?- topiramate (TOPAMAX) 25 MG tablet; Take 1 tablet (25 mg total) by mouth at bedtime.  Dispense: 30 tablet; Refill: 2 ? ? ?Return in about 4 weeks (around 07/23/2021) for Obesity. ? ?Hendricks Limes, MSN, APRN, FNP-C ?[facility] ? ?Subjective:  ? ? Patient ID: Austin Curtis, male    DOB: [birth year], 56 y.o.   MRN: [id number] ? ?Patient Care Team: ?Loman Brooklyn, FNP as PCP - General (Family Medicine) ?Setzer, Rona Ravens, NP (Inactive) as Nurse Practitioner (Internal Medicine)  ? ?Chief Complaint:  ?Chief Complaint  ?Patient presents with  ? Weight Check  ?  4 week follow up  ? ? ?HPI: ?Austin Curtis is a 56 y.o. male presenting on 06/25/2021 for Weight Check (4 week follow up) ? ?Obesity: patient was restarted on phentermine last month due to trouble with eating too frequently. We tried to get Wegovy, but insurance wouldn't cover it. He was previously doing very well with smaller meals. He has been exercising to help with his weight.  ? ?New complaints: ?None ? ? ?Social history: ? ?Relevant past medical, surgical, family and social history reviewed and updated as indicated. Interim medical history since our last visit reviewed. ? ?Allergies and medications reviewed and updated. ? ?DATA REVIEWED: CHART IN EPIC ? ?ROS: Negative unless specifically indicated above in HPI.  ? ? ?Current Outpatient Medications:  ?  linaclotide (LINZESS) 72 MCG capsule, Take 1 capsule (72 mcg total) by mouth daily before breakfast., Disp: 90 capsule, Rfl: 2 ?  losartan (COZAAR) 50 MG tablet, Take 1 tablet (50 mg total) by mouth daily., Disp: 30 tablet, Rfl: 2 ?  phentermine (ADIPEX-P)  37.5 MG tablet, Take 1 tablet (37.5 mg total) by mouth daily before breakfast., Disp: 30 tablet, Rfl: 0  ? ?Allergies  ?Allergen Reactions  ? Penicillins Anaphylaxis  ? Lisinopril Cough  ? ?Past Medical History:  ?Diagnosis Date  ? Blood in stool   ? Constipation   ? Essential hypertension 07/05/2020  ? Hyperuricemia 04/23/2013  ? Kidney stones   ?  ?Past Surgical History:  ?Procedure Laterality Date  ? NO PAST SURGERIES    ?  ?Social History  ? ?Socioeconomic History  ? Marital status: Married  ?  Spouse name: Not on file  ? Number of children: Not on file  ? Years of education: Not on file  ? Highest education level: Not on file  ?Occupational History  ? Occupation: Careers information officer at [employer]  ?Tobacco Use  ? Smoking status: Never  ? Smokeless tobacco: Never  ?Vaping Use  ? Vaping Use: Never used  ?Substance and Sexual Activity  ? Alcohol use: No  ?  Alcohol/week: 0.0 standard drinks  ? Drug use: No  ? Sexual activity: Not on file  ?Other Topics Concern  ? Not on file  ?Social History Narrative  ? Not on file  ? ?Social Determinants of Health  ? ?Financial Resource Strain: Not on file  ?Food Insecurity: Not on file  ?Transportation Needs: Not on file  ?Physical Activity: Not on file  ?Stress: Not on file  ?Social Connections: Not on file  ?Intimate Partner Violence: Not on  file  ?  ? ?   ?Objective:  ?  ?BP 121/82   Pulse 85   Temp (!) 97.2 ?F (36.2 ?C) (Temporal)   Ht 6' 1"  (1.854 m)   Wt 233 lb 9.6 oz (106 kg)   SpO2 98%   BMI 30.82 kg/m?  ? ?Wt Readings from Last 3 Encounters:  ?06/25/21 233 lb 9.6 oz (106 kg)  ?05/27/21 241 lb 9.6 oz (109.6 kg)  ?03/05/21 232 lb (105.2 kg)  ? ? ?Physical Exam ?Vitals reviewed.  ?Constitutional:   ?   General: He is not in acute distress. ?   Appearance: Normal appearance. He is obese. He is not ill-appearing, toxic-appearing or diaphoretic.  ?HENT:  ?   Head: Normocephalic and atraumatic.  ?Eyes:  ?   General: No scleral icterus.    ?   Right eye: No  discharge.     ?   Left eye: No discharge.  ?   Conjunctiva/sclera: Conjunctivae normal.  ?Cardiovascular:  ?   Rate and Rhythm: Normal rate and regular rhythm.  ?   Heart sounds: Normal heart sounds. No murmur heard. ?  No friction rub. No gallop.  ?Pulmonary:  ?   Effort: Pulmonary effort is normal. No respiratory distress.  ?   Breath sounds: Normal breath sounds. No stridor. No wheezing, rhonchi or rales.  ?Musculoskeletal:     ?   General: Normal range of motion.  ?   Cervical back: Normal range of motion.  ?Skin: ?   General: Skin is warm and dry.  ?Neurological:  ?   Mental Status: He is alert and oriented to person, place, and time. Mental status is at baseline.  ?Psychiatric:     ?   Mood and Affect: Mood normal.     ?   Behavior: Behavior normal.     ?   Thought Content: Thought content normal.     ?   Judgment: Judgment normal.  ? ? ?No results found for: TSH ?Lab Results  ?Component Value Date  ? WBC 7.9 10/09/2020  ? HGB 15.2 10/09/2020  ? HCT 45.6 10/09/2020  ? MCV 87 10/09/2020  ? PLT 226 10/09/2020  ? ?Lab Results  ?Component Value Date  ? NA 142 10/09/2020  ? K 4.7 10/09/2020  ? CO2 24 10/09/2020  ? GLUCOSE 81 10/09/2020  ? BUN 13 10/09/2020  ? CREATININE 1.22 10/09/2020  ? BILITOT 0.3 10/09/2020  ? ALKPHOS 123 (H) 10/09/2020  ? AST 29 10/09/2020  ? ALT 37 10/09/2020  ? PROT 6.7 10/09/2020  ? ALBUMIN 4.5 10/09/2020  ? CALCIUM 9.2 10/09/2020  ? EGFR 70 10/09/2020  ? ?Lab Results  ?Component Value Date  ? CHOL 134 05/13/2021  ? ?Lab Results  ?Component Value Date  ? HDL 37 05/13/2021  ? ?Lab Results  ?Component Value Date  ? [place] 73 05/13/2021  ? ?Lab Results  ?Component Value Date  ? TRIG 120 05/13/2021  ? ?Lab Results  ?Component Value Date  ? CHOLHDL 5.1 (H) 10/09/2020  ? ?No results found for: HGBA1C ? ?   ? ? ? ? ? ?

## 2021-06-26 ENCOUNTER — Encounter: Payer: Self-pay | Admitting: Family Medicine

## 2021-07-04 ENCOUNTER — Other Ambulatory Visit: Payer: Self-pay | Admitting: Family Medicine

## 2021-07-04 DIAGNOSIS — I10 Essential (primary) hypertension: Secondary | ICD-10-CM

## 2021-07-23 ENCOUNTER — Ambulatory Visit: Payer: 59 | Admitting: Family Medicine

## 2021-07-30 ENCOUNTER — Encounter: Payer: Self-pay | Admitting: Family Medicine

## 2021-07-30 ENCOUNTER — Ambulatory Visit (INDEPENDENT_AMBULATORY_CARE_PROVIDER_SITE_OTHER): Payer: 59 | Admitting: Family Medicine

## 2021-07-30 DIAGNOSIS — E669 Obesity, unspecified: Secondary | ICD-10-CM

## 2021-07-30 MED ORDER — TOPIRAMATE 50 MG PO TABS: 50.0000 mg | ORAL_TABLET | Freq: Every day | ORAL | 2 refills | Status: DC

## 2021-07-30 NOTE — Patient Instructions (Signed)
Here is a guide to help us find out which weight loss medications will be covered by your insurance plan.  Please check out this web site  [email or web address] and follow the 3 simple steps.   There is also a phone number you can call if you do not have access to the Internet. [phone] (Monday- Friday 8am-8pm)  [employer] provides coverage information for more than 80% of the inquiries submitted!!  

## 2021-07-30 NOTE — Progress Notes (Signed)
? ?Assessment & Plan:  ?1. Obesity (BMI 30-39.9) ?Starting weight: 241 lbs ?Today's weight: 236 lbs ?Total weight lost: 5 lbs; he has gained 3 lbs in the past month. ?Increased Topamax from 25 mg to 50 mg to help curb his appetite more. Continue healthy eating, small portions, and exercise. ?- topiramate (TOPAMAX) 50 MG tablet; Take 1 tablet (50 mg total) by mouth at bedtime.  Dispense: 30 tablet; Refill: 2 ? ? ?Return in about 4 weeks (around 08/27/2021) for Weight. ? ?Hendricks Limes, MSN, APRN, FNP-C ?[facility] ? ?Subjective:  ? ? Patient ID: Austin Curtis, male    DOB: Dec 03, 1965, 55 y.o.   MRN: [id number] ? ?Patient Care Team: ?Loman Brooklyn, FNP as PCP - General (Family Medicine) ?Setzer, Rona Ravens, NP (Inactive) as Nurse Practitioner (Internal Medicine)  ? ?Chief Complaint:  ?Chief Complaint  ?Patient presents with  ? Medical Management of Chronic Issues  ? Obesity  ?  Added topiramate at last visit. Needs to titrate up  ? ? ?HPI: ?Austin Curtis is a 56 y.o. male presenting on 07/30/2021 for Medical Management of Chronic Issues and Obesity (Added topiramate at last visit. Needs to titrate up) ? ?Obesity: patient was restarted on phentermine two months, which he has stopped. He was started on Topamax a month ago to help curb his appetite. He does not feel it has been helpful at the lowest dose. We tried to get Wegovy, but insurance wouldn't cover it. He was previously doing very well with smaller meals. He has been exercising to help with his weight.  ? ?New complaints: ?None ? ? ?Social history: ? ?Relevant past medical, surgical, family and social history reviewed and updated as indicated. Interim medical history since our last visit reviewed. ? ?Allergies and medications reviewed and updated. ? ?DATA REVIEWED: CHART IN EPIC ? ?ROS: Negative unless specifically indicated above in HPI.  ? ? ?Current Outpatient Medications:  ?  linaclotide (LINZESS) 72 MCG capsule, Take 1 capsule (72 mcg  total) by mouth daily before breakfast., Disp: 90 capsule, Rfl: 2 ?  losartan (COZAAR) 50 MG tablet, TAKE 1 TABLET BY MOUTH EVERY DAY, Disp: 90 tablet, Rfl: 1 ?  topiramate (TOPAMAX) 25 MG tablet, Take 1 tablet (25 mg total) by mouth at bedtime., Disp: 30 tablet, Rfl: 2  ? ?Allergies  ?Allergen Reactions  ? Penicillins Anaphylaxis  ? Lisinopril Cough  ? ?Past Medical History:  ?Diagnosis Date  ? Blood in stool   ? Constipation   ? Essential hypertension 07/05/2020  ? Hyperuricemia 04/23/2013  ? Kidney stones   ?  ?Past Surgical History:  ?Procedure Laterality Date  ? NO PAST SURGERIES    ?  ?Social History  ? ?Socioeconomic History  ? Marital status: Married  ?  Spouse name: Not on file  ? Number of children: Not on file  ? Years of education: Not on file  ? Highest education level: Not on file  ?Occupational History  ? Occupation: Careers information officer at [employer]  ?Tobacco Use  ? Smoking status: Never  ? Smokeless tobacco: Never  ?Vaping Use  ? Vaping Use: Never used  ?Substance and Sexual Activity  ? Alcohol use: No  ?  Alcohol/week: 0.0 standard drinks  ? Drug use: No  ? Sexual activity: Not on file  ?Other Topics Concern  ? Not on file  ?Social History Narrative  ? Not on file  ? ?Social Determinants of Health  ? ?Financial Resource Strain: Not on  file  ?Food Insecurity: Not on file  ?Transportation Needs: Not on file  ?Physical Activity: Not on file  ?Stress: Not on file  ?Social Connections: Not on file  ?Intimate Partner Violence: Not on file  ?  ? ?   ?Objective:  ?  ?BP 136/84   Pulse 81   Ht 6' 1"  (1.854 m)   Wt 236 lb (107 kg)   SpO2 98%   BMI 31.14 kg/m?  ? ?Wt Readings from Last 3 Encounters:  ?07/30/21 236 lb (107 kg)  ?06/25/21 233 lb 9.6 oz (106 kg)  ?05/27/21 241 lb 9.6 oz (109.6 kg)  ? ? ?Physical Exam ?Vitals reviewed.  ?Constitutional:   ?   General: He is not in acute distress. ?   Appearance: Normal appearance. He is obese. He is not ill-appearing, toxic-appearing or diaphoretic.   ?HENT:  ?   Head: Normocephalic and atraumatic.  ?Eyes:  ?   General: No scleral icterus.    ?   Right eye: No discharge.     ?   Left eye: No discharge.  ?   Conjunctiva/sclera: Conjunctivae normal.  ?Cardiovascular:  ?   Rate and Rhythm: Normal rate and regular rhythm.  ?   Heart sounds: Normal heart sounds. No murmur heard. ?  No friction rub. No gallop.  ?Pulmonary:  ?   Effort: Pulmonary effort is normal. No respiratory distress.  ?   Breath sounds: Normal breath sounds. No stridor. No wheezing, rhonchi or rales.  ?Musculoskeletal:     ?   General: Normal range of motion.  ?   Cervical back: Normal range of motion.  ?Skin: ?   General: Skin is warm and dry.  ?Neurological:  ?   Mental Status: He is alert and oriented to person, place, and time. Mental status is at baseline.  ?Psychiatric:     ?   Mood and Affect: Mood normal.     ?   Behavior: Behavior normal.     ?   Thought Content: Thought content normal.     ?   Judgment: Judgment normal.  ? ? ?No results found for: TSH ?Lab Results  ?Component Value Date  ? WBC 7.9 10/09/2020  ? HGB 15.2 10/09/2020  ? HCT 45.6 10/09/2020  ? MCV 87 10/09/2020  ? PLT 226 10/09/2020  ? ?Lab Results  ?Component Value Date  ? NA 142 10/09/2020  ? K 4.7 10/09/2020  ? CO2 24 10/09/2020  ? GLUCOSE 81 10/09/2020  ? BUN 13 10/09/2020  ? CREATININE 1.22 10/09/2020  ? BILITOT 0.3 10/09/2020  ? ALKPHOS 123 (H) 10/09/2020  ? AST 29 10/09/2020  ? ALT 37 10/09/2020  ? PROT 6.7 10/09/2020  ? ALBUMIN 4.5 10/09/2020  ? CALCIUM 9.2 10/09/2020  ? EGFR 70 10/09/2020  ? ?Lab Results  ?Component Value Date  ? CHOL 134 05/13/2021  ? ?Lab Results  ?Component Value Date  ? HDL 37 05/13/2021  ? ?Lab Results  ?Component Value Date  ? [place] 73 05/13/2021  ? ?Lab Results  ?Component Value Date  ? TRIG 120 05/13/2021  ? ?Lab Results  ?Component Value Date  ? CHOLHDL 5.1 (H) 10/09/2020  ? ?No results found for: HGBA1C ? ?   ? ? ? ? ? ?

## 2021-08-27 ENCOUNTER — Ambulatory Visit: Payer: 59 | Admitting: Family Medicine

## 2021-09-04 ENCOUNTER — Encounter: Payer: Self-pay | Admitting: Family Medicine

## 2021-09-04 ENCOUNTER — Ambulatory Visit (INDEPENDENT_AMBULATORY_CARE_PROVIDER_SITE_OTHER): Payer: 59 | Admitting: Family Medicine

## 2021-09-04 VITALS — BP 126/79 | HR 87 | Temp 97.5°F | Ht 73.0 in | Wt 241.2 lb

## 2021-09-04 DIAGNOSIS — E669 Obesity, unspecified: Secondary | ICD-10-CM | POA: Diagnosis not present

## 2021-09-04 NOTE — Patient Instructions (Signed)
Here is a guide to help us find out which weight loss medications will be covered by your insurance plan.  Please check out this web site  [email or web address] and follow the 3 simple steps.   There is also a phone number you can call if you do not have access to the Internet. [phone] (Monday- Friday 8am-8pm)  [employer] provides coverage information for more than 80% of the inquiries submitted!!  

## 2021-09-04 NOTE — Progress Notes (Signed)
Assessment & Plan:  1. Obesity (BMI 30-39.9) Starting weight: 241 lbs Today's weight: 241 lbs Patient to continue healthy eating and exercise. He is going to check with his insurance and/or [email or web address] to see if a weight loss medication is covered.   Return in about 3 months (around 12/05/2021) for annual physical.  Hendricks Limes, MSN, APRN, FNP-C [facility]  Subjective:    Patient ID: Austin Curtis, male    DOB: 30-Dec-1965, 56 y.o.   MRN: [id number]  Patient Care Team: Loman Brooklyn, FNP as PCP - General (Family Medicine) Vaughan Basta, Rona Ravens, NP (Inactive) as Nurse Practitioner (Internal Medicine)   Chief Complaint:  Chief Complaint  Patient presents with   Weight Check   Obesity    HPI: Austin Curtis is a 56 y.o. male presenting on 09/04/2021 for Weight Check and Obesity  Obesity: patient was previously on phentermine, which he stopped. He was started on Topamax to help curb his appetite, which he did not feel was helpful at the lowest dose. His dose was increased 4 weeks ago and he reports today he had to stop it as it made him have significant brain fog. We tried to get Wegovy, but insurance wouldn't cover it. He was previously doing very well with smaller meals on phentermine. He has been exercising to help with his weight.   New complaints: None   Social history:  Relevant past medical, surgical, family and social history reviewed and updated as indicated. Interim medical history since our last visit reviewed.  Allergies and medications reviewed and updated.  DATA REVIEWED: CHART IN EPIC  ROS: Negative unless specifically indicated above in HPI.    Current Outpatient Medications:    linaclotide (LINZESS) 72 MCG capsule, Take 1 capsule (72 mcg total) by mouth daily before breakfast., Disp: 90 capsule, Rfl: 2   losartan (COZAAR) 50 MG tablet, TAKE 1 TABLET BY MOUTH EVERY DAY, Disp: 90 tablet, Rfl: 1   Allergies  Allergen Reactions    Penicillins Anaphylaxis   Lisinopril Cough   Past Medical History:  Diagnosis Date   Blood in stool    Constipation    Essential hypertension 07/05/2020   Hyperuricemia 04/23/2013   Kidney stones     Past Surgical History:  Procedure Laterality Date   NO PAST SURGERIES      Social History   Socioeconomic History   Marital status: Married    Spouse name: Not on file   Number of children: Not on file   Years of education: Not on file   Highest education level: Not on file  Occupational History   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   Smoking status: Never   Smokeless tobacco: Never  Vaping Use   Vaping Use: Never used  Substance and Sexual Activity   Alcohol use: No    Alcohol/week: 0.0 standard drinks   Drug use: No   Sexual activity: Not on file  Other Topics Concern   Not on file  Social History Narrative   Not on file   Social Determinants of Health   Financial Resource Strain: Not on file  Food Insecurity: Not on file  Transportation Needs: Not on file  Physical Activity: Not on file  Stress: Not on file  Social Connections: Not on file  Intimate Partner Violence: Not on file        Objective:    BP 126/79   Pulse 87   Temp (!) 97.5 F (  36.4 C)   Ht 6' 1"  (1.854 m)   Wt 241 lb 3.2 oz (109.4 kg)   SpO2 96%   BMI 31.82 kg/m   Wt Readings from Last 3 Encounters:  09/04/21 241 lb 3.2 oz (109.4 kg)  07/30/21 236 lb (107 kg)  06/25/21 233 lb 9.6 oz (106 kg)    Physical Exam Vitals reviewed.  Constitutional:      General: He is not in acute distress.    Appearance: Normal appearance. He is obese. He is not ill-appearing, toxic-appearing or diaphoretic.  HENT:     Head: Normocephalic and atraumatic.  Eyes:     General: No scleral icterus.       Right eye: No discharge.        Left eye: No discharge.     Conjunctiva/sclera: Conjunctivae normal.  Cardiovascular:     Rate and Rhythm: Normal rate and regular rhythm.      Heart sounds: Normal heart sounds. No murmur heard.   No friction rub. No gallop.  Pulmonary:     Effort: Pulmonary effort is normal. No respiratory distress.     Breath sounds: Normal breath sounds. No stridor. No wheezing, rhonchi or rales.  Musculoskeletal:        General: Normal range of motion.     Cervical back: Normal range of motion.  Skin:    General: Skin is warm and dry.  Neurological:     Mental Status: He is alert and oriented to person, place, and time. Mental status is at baseline.  Psychiatric:        Mood and Affect: Mood normal.        Behavior: Behavior normal.        Thought Content: Thought content normal.        Judgment: Judgment normal.    No results found for: TSH Lab Results  Component Value Date   WBC 7.9 10/09/2020   HGB 15.2 10/09/2020   HCT 45.6 10/09/2020   MCV 87 10/09/2020   PLT 226 10/09/2020   Lab Results  Component Value Date   NA 142 10/09/2020   K 4.7 10/09/2020   CO2 24 10/09/2020   GLUCOSE 81 10/09/2020   BUN 13 10/09/2020   CREATININE 1.22 10/09/2020   BILITOT 0.3 10/09/2020   ALKPHOS 123 (H) 10/09/2020   AST 29 10/09/2020   ALT 37 10/09/2020   PROT 6.7 10/09/2020   ALBUMIN 4.5 10/09/2020   CALCIUM 9.2 10/09/2020   EGFR 70 10/09/2020   Lab Results  Component Value Date   CHOL 134 05/13/2021   Lab Results  Component Value Date   HDL 37 05/13/2021   Lab Results  Component Value Date   LDLCALC 73 05/13/2021   Lab Results  Component Value Date   TRIG 120 05/13/2021   Lab Results  Component Value Date   CHOLHDL 5.1 (H) 10/09/2020   No results found for: HGBA1C

## 2021-09-15 ENCOUNTER — Encounter: Payer: Self-pay | Admitting: Family Medicine

## 2021-09-28 ENCOUNTER — Other Ambulatory Visit: Payer: Self-pay | Admitting: Family Medicine

## 2021-09-28 DIAGNOSIS — K5904 Chronic idiopathic constipation: Secondary | ICD-10-CM

## 2021-10-25 ENCOUNTER — Other Ambulatory Visit: Payer: Self-pay | Admitting: Family Medicine

## 2021-10-25 DIAGNOSIS — E669 Obesity, unspecified: Secondary | ICD-10-CM

## 2021-11-27 ENCOUNTER — Encounter: Payer: Self-pay | Admitting: Family Medicine

## 2021-11-27 ENCOUNTER — Ambulatory Visit: Payer: 59 | Admitting: Family Medicine

## 2021-11-27 VITALS — BP 134/82 | HR 90 | Temp 97.2°F | Ht 73.0 in | Wt 249.8 lb

## 2021-11-27 DIAGNOSIS — R066 Hiccough: Secondary | ICD-10-CM

## 2021-11-27 MED ORDER — BACLOFEN 10 MG PO TABS: 10.0000 mg | ORAL_TABLET | Freq: Three times a day (TID) | ORAL | 0 refills | Status: DC

## 2021-11-27 MED ORDER — PANTOPRAZOLE SODIUM 20 MG PO TBEC: 20.0000 mg | DELAYED_RELEASE_TABLET | Freq: Two times a day (BID) | ORAL | 2 refills | Status: DC

## 2021-11-27 NOTE — Progress Notes (Signed)
   Assessment & Plan:  1. Intractable hiccups Education provided on hiccups. - baclofen (LIORESAL) 10 MG tablet; Take 1 tablet (10 mg total) by mouth 3 (three) times daily.  Dispense: 60 each; Refill: 0 - pantoprazole (PROTONIX) 20 MG tablet; Take 1 tablet (20 mg total) by mouth 2 (two) times daily before a meal.  Dispense: 60 tablet; Refill: 2   Follow up plan: Return if symptoms worsen or fail to improve.  [name], MSN, APRN, FNP-C [facility]  Subjective:   Patient ID: Austin Curtis, male    DOB: 01/26/1966, 56 y.o.   MRN: [id number]  HPI: [name] is a 56 y.o. male presenting on 11/27/2021 for Hiccups (X 7 days straight.  Can not sleep due to this. Patient states it almost feels like he has heart burn. )  Patient reports he has had hiccups nonstop for the past 7 days.  He is unable to sleep or take a deep breath due to the hiccups.  He does feel like he has some heartburn as well, but this has not been relieved with Tums, Pepcid AC, or Tagamet.   ROS: Negative unless specifically indicated above in HPI.   Relevant past medical history reviewed and updated as indicated.   Allergies and medications reviewed and updated.   Current Outpatient Medications:    LINZESS 72 MCG capsule, TAKE 1 CAPSULE BY MOUTH DAILY BEFORE BREAKFAST., Disp: 90 capsule, Rfl: 0   losartan (COZAAR) 50 MG tablet, TAKE 1 TABLET BY MOUTH EVERY DAY, Disp: 90 tablet, Rfl: 1  Allergies  Allergen Reactions   Penicillins Anaphylaxis   Lisinopril Cough    Objective:   BP 134/82   Pulse 90   Temp (!) 97.2 F (36.2 C) (Temporal)   Ht 6\' 1"  (1.854 m)   Wt 249 lb 12.8 oz (113.3 kg)   SpO2 96%   BMI 32.96 kg/m    Physical Exam Vitals reviewed.  Constitutional:      General: He is not in acute distress.    Appearance: Normal appearance. He is not ill-appearing, toxic-appearing or diaphoretic.     Comments: Continual hiccups.  HENT:     Head: Normocephalic and  atraumatic.  Eyes:     General: No scleral icterus.       Right eye: No discharge.        Left eye: No discharge.     Conjunctiva/sclera: Conjunctivae normal.  Cardiovascular:     Rate and Rhythm: Normal rate.  Pulmonary:     Effort: Pulmonary effort is normal. No respiratory distress.  Musculoskeletal:        General: Normal range of motion.     Cervical back: Normal range of motion.  Skin:    General: Skin is warm and dry.  Neurological:     Mental Status: He is alert and oriented to person, place, and time. Mental status is at baseline.  Psychiatric:        Mood and Affect: Mood normal.        Behavior: Behavior normal.        Thought Content: Thought content normal.        Judgment: Judgment normal.

## 2021-12-10 ENCOUNTER — Encounter: Payer: 59 | Admitting: Family Medicine

## 2021-12-13 ENCOUNTER — Other Ambulatory Visit: Payer: Self-pay | Admitting: Family Medicine

## 2021-12-13 DIAGNOSIS — R066 Hiccough: Secondary | ICD-10-CM

## 2021-12-17 ENCOUNTER — Encounter: Payer: Self-pay | Admitting: Family Medicine

## 2021-12-17 ENCOUNTER — Ambulatory Visit (INDEPENDENT_AMBULATORY_CARE_PROVIDER_SITE_OTHER): Payer: 59 | Admitting: Family Medicine

## 2021-12-17 VITALS — BP 143/86 | HR 84 | Temp 97.9°F | Resp 20 | Ht 73.0 in | Wt 255.0 lb

## 2021-12-17 DIAGNOSIS — E669 Obesity, unspecified: Secondary | ICD-10-CM | POA: Diagnosis not present

## 2021-12-17 DIAGNOSIS — K5904 Chronic idiopathic constipation: Secondary | ICD-10-CM | POA: Diagnosis not present

## 2021-12-17 DIAGNOSIS — I10 Essential (primary) hypertension: Secondary | ICD-10-CM

## 2021-12-17 DIAGNOSIS — Z0001 Encounter for general adult medical examination with abnormal findings: Secondary | ICD-10-CM | POA: Diagnosis not present

## 2021-12-17 DIAGNOSIS — Z0000 Encounter for general adult medical examination without abnormal findings: Secondary | ICD-10-CM

## 2021-12-17 MED ORDER — LINACLOTIDE 72 MCG PO CAPS: 72.0000 ug | ORAL_CAPSULE | Freq: Every day | ORAL | 3 refills | Status: DC

## 2021-12-17 MED ORDER — LOSARTAN POTASSIUM 50 MG PO TABS: 50.0000 mg | ORAL_TABLET | Freq: Every day | ORAL | 3 refills | Status: DC

## 2021-12-17 MED ORDER — PHENTERMINE HCL 37.5 MG PO TABS: 37.5000 mg | ORAL_TABLET | Freq: Every day | ORAL | 0 refills | Status: DC

## 2021-12-17 NOTE — Progress Notes (Signed)
Assessment & Plan:  Well adult exam Discussed health benefits of physical activity, and encouraged him to engage in regular exercise appropriate for his age and condition. Preventive health education provided. Declined HIV screening, COVID booster, and the influenza vaccine. He will get the influenza vaccine next month at work; asked to please let us know when he does so we can update his chart.   Immunization History  Administered Date(s) Administered   Influenza, Quadrivalent, Recombinant, Inj, Pf 12/13/2017   Influenza-Unspecified 02/22/2017, 12/13/2017, 05/10/2020, 02/12/2021   PFIZER(Purple Top)SARS-COV-2 Vaccination 06/21/2019, 07/19/2019, 02/02/2020   Tdap 10/26/2014   Zoster Recombinat (Shingrix) 06/08/2018, 09/29/2018   Health Maintenance  Topic Date Due   HIV Screening  06/26/2022 (Originally 11/27/1980)   INFLUENZA VACCINE  07/05/2022 (Originally 11/04/2021)   COVID-19 Vaccine (4 - Pfizer series) 08/17/2022 (Originally 03/29/2020)   COLONOSCOPY (Pts 45-96yr Insurance coverage will need to be confirmed)  07/19/2024   TETANUS/TDAP  10/25/2024   Hepatitis C Screening  Completed   Zoster Vaccines- Shingrix  Completed   HPV VACCINES  Aged Out   - CBC with Differential/Platelet - CMP14+EGFR - Lipid panel - PSA, total and free  2. Essential hypertension Well controlled on current regimen per home readings. - losartan (COZAAR) 50 MG tablet; Take 1 tablet (50 mg total) by mouth daily.  Dispense: 90 tablet; Refill: 3 - CBC with Differential/Platelet - CMP14+EGFR - Lipid panel  3. Chronic idiopathic constipation Well controlled on current regimen.  - linaclotide (LINZESS) 72 MCG capsule; Take 1 capsule (72 mcg total) by mouth daily before breakfast.  Dispense: 90 capsule; Refill: 3 - CMP14+EGFR  4. Obesity (BMI 30-39.9) Continue healthy eating and exercise. Start Adipex today. PDMP reviewed with no concerning findings. Starting weight: 255 lbs - CBC with  Differential/Platelet - CMP14+EGFR - Lipid panel - phentermine (ADIPEX-P) 37.5 MG tablet; Take 1 tablet (37.5 mg total) by mouth daily before breakfast.  Dispense: 30 tablet; Refill: 0   Follow-up: Return in about 4 weeks (around 01/14/2022) for Weight with M. Rakes.   BHendricks Limes MSN, APRN, FNP-C [facility]  Subjective:  Patient ID: Austin Curtis male    DOB: [birth year] Age: 56y.o. MRN: [id number] Patient Care Team: JLoman Brooklyn FNP as PCP - General (Family Medicine) SVaughan Basta TRona Ravens NP (Inactive) as Nurse Practitioner (Internal Medicine)   CC:  Chief Complaint  Patient presents with   Annual Exam    HPI DSHARIFF LASKYis a 56y.o. male who presents today for a complete physical exam. He reports consuming a  healthy  diet.  He does participate in regular physical exercise.  He generally feels well. He reports sleeping well. He does not have additional problems to discuss today.   Vision:Within last year Dental:Receives regular dental care  POBS:[id number] screening and PSA options (with potential risks and benefits of testing vs. not testing) were discussed along with recent recs/guidelines. Agrees to PSA testing. Lab Results  Component Value Date   PSA 0.6 05/02/2014     DEPRESSION SCREENING    12/17/2021    3:49 PM 11/27/2021    2:09 PM 09/04/2021    4:16 PM 07/30/2021    8:21 AM 06/25/2021    3:43 PM 05/27/2021    3:28 PM 03/05/2021    3:36 PM  PHQ 2/9 Scores  PHQ - 2 Score 0 0 0 0 0 0 0  PHQ- 9 Score 0 0 0 0 0 0 0    Hypertension:  occasionally monitors his blood pressure at home and reports good readings.  Constipation: regular/soft bowel movements with Linzess.  Obesity: patient eats smaller healthy meals and participates in regular physical exercise. He has taken Adipex in the past and lost weight with it, but he has been gaining it back. Insurance does not cover [employer] or [employer].   Review of Systems  Constitutional:   Negative for chills, fever, malaise/fatigue and weight loss.  HENT:  Negative for congestion, ear discharge, ear pain, nosebleeds, sinus pain, sore throat and tinnitus.   Eyes:  Negative for blurred vision, double vision, pain, discharge and redness.  Respiratory:  Negative for cough, shortness of breath and wheezing.   Cardiovascular:  Negative for chest pain, palpitations and leg swelling.  Gastrointestinal:  Negative for abdominal pain, constipation, diarrhea, heartburn, nausea and vomiting.  Genitourinary:  Negative for dysuria, frequency and urgency.       Denies trouble initiating a urine stream, weak stream, split stream, nocturia, and dribbling.   Musculoskeletal:  Negative for myalgias.  Skin:  Negative for rash.  Neurological:  Negative for dizziness, seizures, weakness and headaches.  Psychiatric/Behavioral:  Negative for depression, substance abuse and suicidal ideas. The patient is not nervous/anxious.      Current Outpatient Medications:    baclofen (LIORESAL) 10 MG tablet, Take 1 tablet (10 mg total) by mouth 3 (three) times daily., Disp: 60 each, Rfl: 0   LINZESS 72 MCG capsule, TAKE 1 CAPSULE BY MOUTH DAILY BEFORE BREAKFAST., Disp: 90 capsule, Rfl: 0   losartan (COZAAR) 50 MG tablet, TAKE 1 TABLET BY MOUTH EVERY DAY, Disp: 90 tablet, Rfl: 1   pantoprazole (PROTONIX) 20 MG tablet, Take 1 tablet (20 mg total) by mouth 2 (two) times daily before a meal., Disp: 60 tablet, Rfl: 2  Allergies  Allergen Reactions   Penicillins Anaphylaxis   Lisinopril Cough    Past Medical History:  Diagnosis Date   Blood in stool    Constipation    Essential hypertension 07/05/2020   Hyperuricemia 04/23/2013   Kidney stones     Past Surgical History:  Procedure Laterality Date   NO PAST SURGERIES      Family History  Problem Relation Age of Onset   Bladder Cancer Father    Early death Sister        MVA at 54 years of age   Colon cancer Neg Hx     Social History    Socioeconomic History   Marital status: Married    Spouse name: Not on file   Number of children: Not on file   Years of education: Not on file   Highest education level: Not on file  Occupational History   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   Smoking status: Never   Smokeless tobacco: Never  Vaping Use   Vaping Use: Never used  Substance and Sexual Activity   Alcohol use: No    Alcohol/week: 0.0 standard drinks of alcohol   Drug use: No   Sexual activity: Not on file  Other Topics Concern   Not on file  Social History Narrative   Not on file   Social Determinants of Health   Financial Resource Strain: Not on file  Food Insecurity: Not on file  Transportation Needs: Not on file  Physical Activity: Not on file  Stress: Not on file  Social Connections: Not on file  Intimate Partner Violence: Not on file      Objective:  BP (!) 143/86   Pulse 84   Temp 97.9 F (36.6 C)   Resp 20   Ht 6' 1"  (1.854 m)   Wt 255 lb (115.7 kg)   SpO2 95%   BMI 33.64 kg/m   BP Readings from Last 3 Encounters:  12/17/21 (!) 143/86  11/27/21 134/82  09/04/21 126/79   Wt Readings from Last 3 Encounters:  12/17/21 255 lb (115.7 kg)  11/27/21 249 lb 12.8 oz (113.3 kg)  09/04/21 241 lb 3.2 oz (109.4 kg)    Physical Exam Vitals reviewed.  Constitutional:      General: He is not in acute distress.    Appearance: Normal appearance. He is obese. He is not ill-appearing, toxic-appearing or diaphoretic.  HENT:     Head: Normocephalic and atraumatic.     Right Ear: Tympanic membrane, ear canal and external ear normal. There is no impacted cerumen.     Left Ear: Tympanic membrane, ear canal and external ear normal. There is no impacted cerumen.     Nose: Nose normal. No congestion or rhinorrhea.     Mouth/Throat:     Mouth: Mucous membranes are moist.     Pharynx: Oropharynx is clear. No oropharyngeal exudate or posterior oropharyngeal erythema.   Eyes:     General: No scleral icterus.       Right eye: No discharge.        Left eye: No discharge.     Conjunctiva/sclera: Conjunctivae normal.     Pupils: Pupils are equal, round, and reactive to light.  Neck:     Vascular: No carotid bruit.  Cardiovascular:     Rate and Rhythm: Normal rate and regular rhythm.     Heart sounds: Normal heart sounds. No murmur heard.    No friction rub. No gallop.  Pulmonary:     Effort: Pulmonary effort is normal. No respiratory distress.     Breath sounds: Normal breath sounds. No stridor. No wheezing, rhonchi or rales.  Abdominal:     General: Abdomen is flat. Bowel sounds are normal. There is no distension.     Palpations: Abdomen is soft. There is no hepatomegaly, splenomegaly or mass.     Tenderness: There is no abdominal tenderness. There is no guarding or rebound.     Hernia: No hernia is present.  Musculoskeletal:        General: Normal range of motion.     Cervical back: Normal range of motion and neck supple. No rigidity. No muscular tenderness.     Right lower leg: No edema.     Left lower leg: No edema.  Lymphadenopathy:     Cervical: No cervical adenopathy.  Skin:    General: Skin is warm and dry.     Capillary Refill: Capillary refill takes less than 2 seconds.  Neurological:     General: No focal deficit present.     Mental Status: He is alert and oriented to person, place, and time. Mental status is at baseline.  Psychiatric:        Mood and Affect: Mood normal.        Behavior: Behavior normal.        Thought Content: Thought content normal.        Judgment: Judgment normal.     No results found for: "TSH" Lab Results  Component Value Date   WBC 7.9 10/09/2020   HGB 15.2 10/09/2020   HCT 45.6 10/09/2020   MCV 87 10/09/2020   PLT 226 10/09/2020  Lab Results  Component Value Date   NA 142 10/09/2020   K 4.7 10/09/2020   CO2 24 10/09/2020   GLUCOSE 81 10/09/2020   BUN 13 10/09/2020   CREATININE 1.22  10/09/2020   BILITOT 0.3 10/09/2020   ALKPHOS 123 (H) 10/09/2020   AST 29 10/09/2020   ALT 37 10/09/2020   PROT 6.7 10/09/2020   ALBUMIN 4.5 10/09/2020   CALCIUM 9.2 10/09/2020   EGFR 70 10/09/2020   Lab Results  Component Value Date   CHOL 134 05/13/2021   Lab Results  Component Value Date   HDL 37 05/13/2021   Lab Results  Component Value Date   LDLCALC 73 05/13/2021   Lab Results  Component Value Date   TRIG 120 05/13/2021   Lab Results  Component Value Date   CHOLHDL 5.1 (H) 10/09/2020   No results found for: "HGBA1C"

## 2021-12-18 LAB — CMP14+EGFR
ALT: 21 IU/L (ref 0–44)
AST: 19 IU/L (ref 0–40)
Albumin/Globulin Ratio: 2 (ref 1.2–2.2)
Albumin: 4.5 g/dL (ref 3.8–4.9)
Alkaline Phosphatase: 110 IU/L (ref 44–121)
BUN/Creatinine Ratio: 15 (ref 9–20)
BUN: 17 mg/dL (ref 6–24)
Bilirubin Total: 0.3 mg/dL (ref 0.0–1.2)
CO2: 26 mmol/L (ref 20–29)
Calcium: 9.2 mg/dL (ref 8.7–10.2)
Chloride: 104 mmol/L (ref 96–106)
Creatinine, Ser: 1.16 mg/dL (ref 0.76–1.27)
Globulin, Total: 2.2 g/dL (ref 1.5–4.5)
Glucose: 82 mg/dL (ref 70–99)
Potassium: 4.4 mmol/L (ref 3.5–5.2)
Sodium: 142 mmol/L (ref 134–144)
Total Protein: 6.7 g/dL (ref 6.0–8.5)
eGFR: 73 mL/min/{1.73_m2} (ref >59)

## 2021-12-18 LAB — CBC WITH DIFFERENTIAL/PLATELET
Basophils Absolute: 0.1 10*3/uL (ref 0.0–0.2)
Basos: 1 %
EOS (ABSOLUTE): 0.1 10*3/uL (ref 0.0–0.4)
Eos: 1 %
Hematocrit: 44.7 % (ref 37.5–51.0)
Hemoglobin: 14.8 g/dL (ref 13.0–17.7)
Immature Grans (Abs): 0 10*3/uL (ref 0.0–0.1)
Immature Granulocytes: 0 %
Lymphocytes Absolute: 1.4 10*3/uL (ref 0.7–3.1)
Lymphs: 16 %
MCH: 29.2 pg (ref 26.6–33.0)
MCHC: 33.1 g/dL (ref 31.5–35.7)
MCV: 88 fL (ref 79–97)
Monocytes Absolute: 0.6 10*3/uL (ref 0.1–0.9)
Monocytes: 7 %
Neutrophils Absolute: 6.5 10*3/uL (ref 1.4–7.0)
Neutrophils: 75 %
Platelets: 196 10*3/uL (ref 150–450)
RBC: 5.06 x10E6/uL (ref 4.14–5.80)
RDW: 12.3 % (ref 11.6–15.4)
WBC: 8.7 10*3/uL (ref 3.4–10.8)

## 2021-12-18 LAB — LIPID PANEL
Chol/HDL Ratio: 3.8 ratio (ref 0.0–5.0)
Cholesterol, Total: 159 mg/dL (ref 100–199)
HDL: 42 mg/dL (ref >39)
LDL Chol Calc (NIH): 90 mg/dL (ref 0–99)
Triglycerides: 153 mg/dL — ABNORMAL HIGH (ref 0–149)
VLDL Cholesterol Cal: 27 mg/dL (ref 5–40)

## 2021-12-18 LAB — PSA, TOTAL AND FREE
PSA, Free Pct: 46 %
PSA, Free: 0.23 ng/mL
Prostate Specific Ag, Serum: 0.5 ng/mL (ref 0.0–4.0)

## 2022-01-15 ENCOUNTER — Ambulatory Visit: Payer: 59 | Admitting: Family Medicine

## 2022-01-15 ENCOUNTER — Encounter: Payer: Self-pay | Admitting: Family Medicine

## 2022-01-15 VITALS — BP 129/80 | HR 104 | Temp 97.6°F | Ht 73.0 in | Wt 243.2 lb

## 2022-01-15 DIAGNOSIS — E669 Obesity, unspecified: Secondary | ICD-10-CM | POA: Diagnosis not present

## 2022-01-15 DIAGNOSIS — R000 Tachycardia, unspecified: Secondary | ICD-10-CM

## 2022-01-15 DIAGNOSIS — Z23 Encounter for immunization: Secondary | ICD-10-CM | POA: Diagnosis not present

## 2022-01-15 DIAGNOSIS — I10 Essential (primary) hypertension: Secondary | ICD-10-CM

## 2022-01-15 DIAGNOSIS — Z79899 Other long term (current) drug therapy: Secondary | ICD-10-CM | POA: Diagnosis not present

## 2022-01-15 DIAGNOSIS — Z7689 Persons encountering health services in other specified circumstances: Secondary | ICD-10-CM

## 2022-01-15 MED ORDER — SEMAGLUTIDE-WEIGHT MANAGEMENT 1.7 MG/0.75ML ~~LOC~~ SOAJ: 1.7000 mg | SUBCUTANEOUS | 0 refills | Status: DC

## 2022-01-15 MED ORDER — SEMAGLUTIDE-WEIGHT MANAGEMENT 1 MG/0.5ML ~~LOC~~ SOAJ: 1.0000 mg | SUBCUTANEOUS | 0 refills | Status: DC

## 2022-01-15 MED ORDER — SEMAGLUTIDE-WEIGHT MANAGEMENT 2.4 MG/0.75ML ~~LOC~~ SOAJ: 2.4000 mg | SUBCUTANEOUS | 0 refills | Status: DC

## 2022-01-15 MED ORDER — PHENTERMINE HCL 37.5 MG PO TABS: 37.5000 mg | ORAL_TABLET | Freq: Every day | ORAL | 0 refills | Status: DC

## 2022-01-15 MED ORDER — SEMAGLUTIDE-WEIGHT MANAGEMENT 0.5 MG/0.5ML ~~LOC~~ SOAJ: 0.5000 mg | SUBCUTANEOUS | 0 refills | Status: DC

## 2022-01-15 MED ORDER — SEMAGLUTIDE-WEIGHT MANAGEMENT 0.25 MG/0.5ML ~~LOC~~ SOAJ: 0.2500 mg | SUBCUTANEOUS | 0 refills | Status: AC

## 2022-01-15 NOTE — Progress Notes (Signed)
Subjective:  Patient ID: OTHER Austin Curtis, male    DOB: Mar 25, 1966, 56 y.o.   MRN: [id number]  Patient Care Team: Baruch Gouty, FNP as PCP - General (Family Medicine) Vaughan Basta, Rona Ravens, NP (Inactive) as Nurse Practitioner (Internal Medicine)   Chief Complaint:  Establish Care [facility] patient ) and Weight Check (4 week follow up )   HPI: Austin Curtis is a 56 y.o. male presenting on 01/15/2022 for [place] [facility] patient ) and Weight Check (4 week follow up )  Presents today to establish care with new PCP as his is no longer at the practice.   1. Obesity (BMI 30-39.9) 2. Encounter for weight management Pt has been on Phentermine intermittently over the last 2 years. No EKG in chart, will obtain today. He is tachycardic in office today and has a history of hypertension. He states he did try Topamax which was not beneficial. He has not tried any other weight loss medications. He follows a fairly healthy diet and runs or walks at least 2 miles daily. He denies palpitations with the phentermine. No headaches, chest pain, dizziness, diaphoresis, or syncope. No insomnia.      Relevant past medical, surgical, family, and social history reviewed and updated as indicated.  Allergies and medications reviewed and updated. Data reviewed: Chart in Epic.   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   Smoking status: Never   Smokeless tobacco: Never  Vaping Use   Vaping Use: Never used  Substance and  Sexual Activity   Alcohol use: No    Alcohol/week: 0.0 standard drinks of alcohol   Drug use: No   Sexual activity: Not on file  Other Topics Concern   Not on file  Social History Narrative   Not on file   Social Determinants of Health   Financial Resource Strain: Not on file  Food Insecurity: Not on file  Transportation Needs: Not on file  Physical Activity: Not on file  Stress: Not on file  Social Connections: Not on file  Intimate Partner Violence: Not on file    Outpatient Encounter Medications as of 01/15/2022  Medication Sig   linaclotide (LINZESS) 72 MCG capsule Take 1 capsule (72 mcg total) by mouth daily before breakfast.   losartan (COZAAR) 50 MG tablet Take 1 tablet (50 mg total) by mouth daily.   [DISCONTINUED] phentermine (ADIPEX-P) 37.5 MG tablet Take 1 tablet (37.5 mg total) by mouth daily before breakfast.   phentermine (ADIPEX-P) 37.5 MG tablet Take 1 tablet (37.5 mg total) by mouth daily before breakfast.   Semaglutide-Weight Management 0.25 MG/0.5ML SOAJ Inject 0.25 mg into the skin once a week for 28 days.   [START ON 02/13/2022] Semaglutide-Weight Management 0.5 MG/0.5ML SOAJ Inject 0.5 mg into the skin once a week for 28 days.   [START ON 03/14/2022] Semaglutide-Weight Management 1 MG/0.5ML SOAJ Inject 1 mg into the skin once  a week for 28 days.   [START ON 04/12/2022] Semaglutide-Weight Management 1.7 MG/0.75ML SOAJ Inject 1.7 mg into the skin once a week for 28 days.   [START ON 05/11/2022] Semaglutide-Weight Management 2.4 MG/0.75ML SOAJ Inject 2.4 mg into the skin once a week for 28 days.   No facility-administered encounter medications on file as of 01/15/2022.    Allergies  Allergen Reactions   Penicillins Anaphylaxis   Lisinopril Cough    Review of Systems  Constitutional:  Negative for activity change, appetite change, chills, diaphoresis, fatigue, fever and unexpected weight change.  HENT: Negative.    Eyes: Negative.  Negative for photophobia  and visual disturbance.  Respiratory:  Negative for cough, chest tightness and shortness of breath.   Cardiovascular:  Negative for chest pain, palpitations and leg swelling.  Gastrointestinal:  Negative for blood in stool, constipation, diarrhea, nausea and vomiting.  Endocrine: Negative.  Negative for polydipsia, polyphagia and polyuria.  Genitourinary:  Negative for decreased urine volume, difficulty urinating, dysuria, frequency and urgency.  Musculoskeletal:  Negative for arthralgias and myalgias.  Skin: Negative.   Allergic/Immunologic: Negative.   Neurological:  Negative for dizziness, tremors, seizures, syncope, facial asymmetry, speech difficulty, weakness, light-headedness, numbness and headaches.  Hematological: Negative.   Psychiatric/Behavioral:  Negative for confusion, hallucinations, sleep disturbance and suicidal ideas.   All other systems reviewed and are negative.       Objective:  BP 129/80   Pulse (!) 104   Temp 97.6 F (36.4 C) (Temporal)   Ht _0  (1.854 m)   Wt 243 lb 3.2 oz (110.3 kg)   SpO2 96%   BMI 32.09 kg/m    Wt Readings from Last 3 Encounters:  01/15/22 243 lb 3.2 oz (110.3 kg)  12/17/21 255 lb (115.7 kg)  11/27/21 249 lb 12.8 oz (113.3 kg)    Physical Exam Vitals and nursing note reviewed.  Constitutional:      General: He is not in acute distress.    Appearance: Normal appearance. He is obese. He is not ill-appearing, toxic-appearing or diaphoretic.  HENT:     Head: Normocephalic.  Eyes:     Conjunctiva/sclera: Conjunctivae normal.     Pupils: Pupils are equal, round, and reactive to light.  Cardiovascular:     Rate and Rhythm: Regular rhythm. Tachycardia present.     Heart sounds: Normal heart sounds. No murmur heard.    No friction rub. No gallop.  Pulmonary:     Effort: Pulmonary effort is normal.     Breath sounds: Normal breath sounds.  Musculoskeletal:     Right lower leg: No edema.     Left lower leg: No edema.  Skin:     General: Skin is warm and dry.     Capillary Refill: Capillary refill takes less than 2 seconds.  Neurological:     General: No focal deficit present.     Mental Status: He is alert and oriented to person, place, and time.     Comments: Stutter  Psychiatric:        Mood and Affect: Mood normal.        Behavior: Behavior normal.        Thought Content: Thought content normal.        Judgment: Judgment normal.     Results for orders placed or performed in visit on 12/17/21  CBC with Differential/Platelet  Result Value Ref Range   WBC 8.7 3.4 - 10.8 x10E3/uL   RBC 5.06 4.14 - 5.80 x10E6/uL  Hemoglobin 14.8 13.0 - 17.7 g/dL   Hematocrit 44.7 37.5 - 51.0 %   MCV 88 79 - 97 fL   MCH 29.2 26.6 - 33.0 pg   MCHC 33.1 31.5 - 35.7 g/dL   RDW 12.3 11.6 - 15.4 %   Platelets 196 150 - 450 x10E3/uL   Neutrophils 75 Not Estab. %   Lymphs 16 Not Estab. %   Monocytes 7 Not Estab. %   Eos 1 Not Estab. %   Basos 1 Not Estab. %   Neutrophils Absolute 6.5 1.4 - 7.0 x10E3/uL   Lymphocytes Absolute 1.4 0.7 - 3.1 x10E3/uL   Monocytes Absolute 0.6 0.1 - 0.9 x10E3/uL   EOS (ABSOLUTE) 0.1 0.0 - 0.4 x10E3/uL   Basophils Absolute 0.1 0.0 - 0.2 x10E3/uL   Immature Granulocytes 0 Not Estab. %   Immature Grans (Abs) 0.0 0.0 - 0.1 x10E3/uL  CMP14+EGFR  Result Value Ref Range   Glucose 82 70 - 99 mg/dL   BUN 17 6 - 24 mg/dL   Creatinine, Ser 1.16 0.76 - 1.27 mg/dL   eGFR 73 >59 mL/min/1.73   BUN/Creatinine Ratio 15 9 - 20   Sodium 142 134 - 144 mmol/L   Potassium 4.4 3.5 - 5.2 mmol/L   Chloride 104 96 - 106 mmol/L   CO2 26 20 - 29 mmol/L   Calcium 9.2 8.7 - 10.2 mg/dL   Total Protein 6.7 6.0 - 8.5 g/dL   Albumin 4.5 3.8 - 4.9 g/dL   Globulin, Total 2.2 1.5 - 4.5 g/dL   Albumin/Globulin Ratio 2.0 1.2 - 2.2   Bilirubin Total 0.3 0.0 - 1.2 mg/dL   Alkaline Phosphatase 110 44 - 121 IU/L   AST 19 0 - 40 IU/L   ALT 21 0 - 44 IU/L  Lipid panel  Result Value Ref Range   Cholesterol, Total 159 100 -  199 mg/dL   Triglycerides 153 (H) 0 - 149 mg/dL   HDL 42 >39 mg/dL   VLDL Cholesterol Cal 27 5 - 40 mg/dL   LDL Chol Calc (NIH) 90 0 - 99 mg/dL   Chol/HDL Ratio 3.8 0.0 - 5.0 ratio  PSA, total and free  Result Value Ref Range   Prostate Specific Ag, Serum 0.5 0.0 - 4.0 ng/mL   PSA, Free 0.23 N/A ng/mL   PSA, Free Pct 46.0 %     EKG: SR 96, PR 138 ms, QT 326 ms, borderline LVH pattern, no acte ST-T changes, no ectopy, no significant changes from prior. Monia Pouch, FNP-C  Pertinent labs & imaging results that were available during my care of the patient were reviewed by me and considered in my medical decision making.  Assessment & Plan:  Refael was seen today for establish care and weight check.  Diagnoses and all orders for this visit:  Obesity (BMI 30-39.9) Encounter for weight management Long discussion about the appropriate use of phentermine for weight management. Pt was slightly tachycardic in office today and has a history of hypertension. Phentermine is not the optimal choice due to comorbidities, will refill for one month only. Will start wegovy if approved by insurance. EKG and controlled substance agreement signed today. Drug screening collected.  -     phentermine (ADIPEX-P) 37.5 MG tablet; Take 1 tablet (37.5 mg total) by mouth daily before breakfast. -     ToxASSURE Select 13 (MW), Urine -     Semaglutide-Weight Management 0.25 MG/0.5ML SOAJ; Inject 0.25 mg into the skin once a week for 28  days. -     Semaglutide-Weight Management 0.5 MG/0.5ML SOAJ; Inject 0.5 mg into the skin once a week for 28 days. -     Semaglutide-Weight Management 1 MG/0.5ML SOAJ; Inject 1 mg into the skin once a week for 28 days. -     Semaglutide-Weight Management 1.7 MG/0.75ML SOAJ; Inject 1.7 mg into the skin once a week for 28 days. -     Semaglutide-Weight Management 2.4 MG/0.75ML SOAJ; Inject 2.4 mg into the skin once a week for 28 days.  Tachycardia EKG without acute findings. Likely due  to phentermine. Will try to get wegovy approved for weight management. Will no longer write for phentermine after this month.  -     EKG 12-Lead   Primary hypertension BP well controlled. Changes were not made in regimen today. Goal BP is 130/80. Pt aware to report any persistent high or low readings. DASH diet and exercise encouraged. Exercise at least 150 minutes per week and increase as tolerated. Goal BMI > 25. Stress management encouraged. Avoid nicotine and tobacco product use. Avoid excessive alcohol and NSAID's. Avoid more than 2000 mg of sodium daily. Medications as prescribed. Follow up as scheduled.  -     EKG 12-Lead  Controlled substance agreement signed -     ToxASSURE Select 13 (MW), Urine  Need for immunization against influenza -     Flu Vaccine QUAD 5moIM (Fluarix, Fluzone & Alfiuria Quad PF)     Continue all other maintenance medications.  Follow up plan: Return if symptoms worsen or fail to improve.   Continue healthy lifestyle choices, including diet (rich in fruits, vegetables, and lean proteins, and low in salt and simple carbohydrates) and exercise (at least 30 minutes of moderate physical activity daily).  Educational handout given for calorie counting for weight loss  The above assessment and management plan was discussed with the patient. The patient verbalized understanding of and has agreed to the management plan. Patient is aware to call the clinic if they develop any new symptoms or if symptoms persist or worsen. Patient is aware when to return to the clinic for a follow-up visit. Patient educated on when it is appropriate to go to the emergency department.   MMonia Pouch FNP-C [facility] [phone]

## 2022-01-19 LAB — TOXASSURE SELECT 13 (MW), URINE

## 2022-02-11 ENCOUNTER — Encounter: Payer: Self-pay | Admitting: Family Medicine

## 2022-02-18 ENCOUNTER — Ambulatory Visit: Payer: 59 | Admitting: Family Medicine

## 2022-02-21 ENCOUNTER — Other Ambulatory Visit: Payer: Self-pay | Admitting: Family Medicine

## 2022-02-21 DIAGNOSIS — R066 Hiccough: Secondary | ICD-10-CM

## 2022-02-25 ENCOUNTER — Telehealth: Payer: Self-pay | Admitting: Family Medicine

## 2022-02-25 ENCOUNTER — Other Ambulatory Visit: Payer: Self-pay | Admitting: Family Medicine

## 2022-02-25 DIAGNOSIS — E669 Obesity, unspecified: Secondary | ICD-10-CM

## 2022-02-25 MED ORDER — SAXENDA 18 MG/3ML ~~LOC~~ SOPN: 0.6000 mg | PEN_INJECTOR | Freq: Every day | SUBCUTANEOUS | 1 refills | Status: DC

## 2022-02-25 NOTE — Telephone Encounter (Signed)
Patient's wife would like to know if they could have Ozempic called in because they are unable to find [facility]. She has called around to other pharmacies and no where has Wegovy at this time.Please call back to let them know.

## 2022-02-25 NOTE — Telephone Encounter (Signed)
Patient returning call. Please call back

## 2022-02-25 NOTE — Telephone Encounter (Signed)
Wife aware and verbalizes understanding per dpr.  °

## 2022-03-18 ENCOUNTER — Other Ambulatory Visit: Payer: Self-pay | Admitting: Family Medicine

## 2022-03-18 DIAGNOSIS — R066 Hiccough: Secondary | ICD-10-CM

## 2022-03-18 NOTE — Telephone Encounter (Signed)
Seen by you in October to establish care.  Was on med list but discontinued by [facility] on 12/17/21.

## 2022-04-07 ENCOUNTER — Telehealth: Payer: Self-pay | Admitting: Family Medicine

## 2022-04-07 ENCOUNTER — Ambulatory Visit: Payer: 59 | Admitting: Family Medicine

## 2022-04-07 ENCOUNTER — Encounter: Payer: Self-pay | Admitting: Family Medicine

## 2022-04-07 VITALS — BP 132/77 | HR 86 | Temp 98.1°F | Ht 73.0 in | Wt 242.0 lb

## 2022-04-07 DIAGNOSIS — E669 Obesity, unspecified: Secondary | ICD-10-CM

## 2022-04-07 DIAGNOSIS — K5904 Chronic idiopathic constipation: Secondary | ICD-10-CM | POA: Diagnosis not present

## 2022-04-07 DIAGNOSIS — I10 Essential (primary) hypertension: Secondary | ICD-10-CM

## 2022-04-07 DIAGNOSIS — K219 Gastro-esophageal reflux disease without esophagitis: Secondary | ICD-10-CM

## 2022-04-07 MED ORDER — LINACLOTIDE 72 MCG PO CAPS: 72.0000 ug | ORAL_CAPSULE | Freq: Every day | ORAL | 3 refills | Status: DC

## 2022-04-07 MED ORDER — ZEPBOUND 5 MG/0.5ML ~~LOC~~ SOAJ: 5.0000 mg | SUBCUTANEOUS | 0 refills | Status: DC

## 2022-04-07 MED ORDER — PANTOPRAZOLE SODIUM 20 MG PO TBEC: 20.0000 mg | DELAYED_RELEASE_TABLET | Freq: Two times a day (BID) | ORAL | 3 refills | Status: DC

## 2022-04-07 MED ORDER — LOSARTAN POTASSIUM 50 MG PO TABS: 50.0000 mg | ORAL_TABLET | Freq: Every day | ORAL | 3 refills | Status: DC

## 2022-04-07 MED ORDER — ZEPBOUND 7.5 MG/0.5ML ~~LOC~~ SOAJ: 7.5000 mg | SUBCUTANEOUS | 0 refills | Status: DC

## 2022-04-07 MED ORDER — ZEPBOUND 2.5 MG/0.5ML ~~LOC~~ SOAJ: 2.5000 mg | SUBCUTANEOUS | 0 refills | Status: DC

## 2022-04-07 NOTE — Patient Instructions (Signed)
Here is a guide to help [place] find out which weight loss medications will be covered by your insurance plan.  Please check out this web site  [employer] and follow the 3 simple steps.   There is also a phone number you can call if you do not have access to the Internet. [phone] (Monday- Friday 8am-8pm)  [facility] provides coverage information for more than 80% of the inquiries submitted!!

## 2022-04-07 NOTE — Progress Notes (Signed)
Subjective:  Patient ID: Austin Curtis, male    DOB: [birth year], 57 y.o.   MRN: [id number]  Patient Care Team: Baruch Gouty, FNP as PCP - General (Family Medicine) Vaughan Basta, Rona Ravens, NP (Inactive) as Nurse Practitioner (Internal Medicine)   Chief Complaint:  Medical Management of Chronic Issues   HPI: Austin Curtis is a 57 y.o. male presenting on 04/07/2022 for Medical Management of Chronic Issues   1. Primary hypertension Complaint with meds - Yes Current Medications - Losartan Exercising Regularly - Yes Watching Salt intake - Yes Pertinent ROS:  Headache - No Fatigue - No Visual Disturbances - No Chest pain - No Dyspnea - No Palpitations - No LE edema - No They report good compliance with medications and can restate their regimen by memory. No medication side effects.  BP Readings from Last 3 Encounters:  04/07/22 132/77  01/15/22 129/80  12/17/21 (!) 143/86    2. Gastroesophageal reflux disease without esophagitis Compliant with medications - Yes Current medications - Protonix Adverse side effects - No Cough - No Sore throat - No Voice change - No Hemoptysis - No Dysphagia or dyspepsia - No Water brash - No Red Flags (weight loss, hematochezia, melena, weight loss, early satiety, fevers, odynophagia, or persistent vomiting) - No  3. Chronic idiopathic constipation Currently on Linzess and tolerating well. Denies changes in bowel habits or weight. No night sweats, fever, chills, weakness, or confusion. Colonoscopy up to date.   4. Obesity (BMI 30-39.9) Was on phentermine in the past with control of weight but was on this intermittent for over 2 years. Aware this was not a long term medication and agreed to injectables. GQQPYP and Kirke Shaggy have been on back order and he has not been able to start either medication. He does try to follow a good diet and is active daily.      Relevant past medical, surgical, family, and social history reviewed and updated as  indicated.  Allergies and medications reviewed and updated. Data reviewed: Chart in Epic.   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   Smoking status: Never   Smokeless tobacco: Never  Vaping Use   Vaping Use: Never used  Substance and Sexual Activity   Alcohol use: No    Alcohol/week: 0.0 standard drinks of alcohol   Drug use: No   Sexual activity: Not on file  Other Topics Concern   Not on file  Social History Narrative   Not on file   Social Determinants of Health   Financial Resource Strain: Not on file  Food Insecurity: Not on file  Transportation Needs: Not on file  Physical Activity: Not on file  Stress: Not on file  Social Connections: Not on file  Intimate Partner Violence: Not on file    Outpatient Encounter Medications as of 04/07/2022  Medication Sig   linaclotide (LINZESS) 72 MCG capsule Take 1  capsule (72 mcg total) by mouth daily before breakfast.   losartan (COZAAR) 50 MG tablet Take 1 tablet (50 mg total) by mouth daily.   pantoprazole (PROTONIX) 20 MG tablet TAKE 1 TABLET (20 MG TOTAL) BY MOUTH 2 (TWO) TIMES DAILY BEFORE A MEAL.   tirzepatide (ZEPBOUND) 2.5 MG/0.5ML Pen Inject 2.5 mg into the skin once a week.   [START ON 05/07/2022] tirzepatide (ZEPBOUND) 5 MG/0.5ML Pen Inject 5 mg into the skin once a week.   [START ON 06/06/2022] tirzepatide (ZEPBOUND) 7.5 MG/0.5ML Pen Inject 7.5 mg into the skin once a week.   [DISCONTINUED] Liraglutide -Weight Management (SAXENDA) 18  MG/3ML SOPN Inject 0.6 mg into the skin daily. (Patient not taking: Reported on 04/07/2022)   [DISCONTINUED] phentermine (ADIPEX-P) 37.5 MG tablet Take 1 tablet (37.5 mg total) by mouth daily before breakfast. (Patient not taking: Reported on 04/07/2022)   No facility-administered encounter medications on file as of 04/07/2022.    Allergies  Allergen Reactions   Penicillins Anaphylaxis   Lisinopril Cough    Review of Systems  Constitutional:  Negative for activity change, appetite change, chills, diaphoresis, fatigue, fever and unexpected weight change.  HENT: Negative.    Eyes: Negative.  Negative for photophobia and visual disturbance.  Respiratory:  Negative for cough, chest tightness and shortness of breath.   Cardiovascular:  Negative for chest pain, palpitations and leg swelling.  Gastrointestinal:  Negative for abdominal pain, blood in stool, constipation, diarrhea, nausea and vomiting.  Endocrine: Negative.  Negative for polydipsia, polyphagia and polyuria.  Genitourinary:  Negative for decreased urine volume, difficulty urinating, dysuria, frequency and urgency.  Musculoskeletal:  Negative for arthralgias and myalgias.  Skin: Negative.   Allergic/Immunologic: Negative.   Neurological:  Negative for dizziness, tremors, seizures, syncope, facial asymmetry, speech difficulty, weakness, light-headedness, numbness and headaches.  Hematological: Negative.   Psychiatric/Behavioral:  Negative for agitation, confusion, hallucinations, sleep disturbance and suicidal ideas.   All other systems reviewed and are negative.       Objective:  BP 132/77   Pulse 86   Temp 98.1 F (36.7 C)   Ht _0  (1.854 m)   Wt 242 lb (109.8 kg)   SpO2 96%   BMI 31.93 kg/m    Wt Readings from Last 3 Encounters:  04/07/22 242 lb (109.8 kg)  01/15/22 243 lb 3.2 oz (110.3 kg)  12/17/21 255 lb (115.7 kg)    Physical Exam Vitals and nursing note reviewed.  Constitutional:      General: He is not in  acute distress.    Appearance: Normal appearance. He is well-developed and well-groomed. He is obese. He is not ill-appearing, toxic-appearing or diaphoretic.  HENT:     Head: Normocephalic and atraumatic.     Jaw: There is normal jaw occlusion.     Right Ear: Hearing normal.     Left Ear: Hearing normal.     Nose: Nose normal.     Mouth/Throat:     Lips: Pink.     Mouth: Mucous membranes are moist.     Pharynx: Oropharynx is clear. Uvula midline.  Eyes:     General: Lids are normal.     Conjunctiva/sclera: Conjunctivae normal.     Pupils: Pupils are equal, round, and reactive to light.  Neck:     Thyroid: No thyroid mass, thyromegaly or thyroid tenderness.     Vascular: No carotid bruit or JVD.     Trachea: Trachea and phonation normal.  Cardiovascular:     Rate and  Rhythm: Normal rate and regular rhythm.     Chest Wall: PMI is not displaced.     Pulses: Normal pulses.     Heart sounds: Normal heart sounds. No murmur heard.    No friction rub. No gallop.  Pulmonary:     Effort: Pulmonary effort is normal. No respiratory distress.     Breath sounds: Normal breath sounds. No wheezing.  Abdominal:     General: Bowel sounds are normal. There is no abdominal bruit.     Palpations: Abdomen is soft. There is no hepatomegaly or splenomegaly.  Musculoskeletal:     Cervical back: Normal range of motion and neck supple.     Right lower leg: No edema.     Left lower leg: No edema.  Lymphadenopathy:     Cervical: No cervical adenopathy.  Skin:    General: Skin is warm and dry.     Capillary Refill: Capillary refill takes less than 2 seconds.     Coloration: Skin is not cyanotic, jaundiced or pale.     Findings: No rash.  Neurological:     General: No focal deficit present.     Mental Status: He is alert and oriented to person, place, and time.     Sensory: Sensation is intact.     Motor: Motor function is intact.     Coordination: Coordination is intact.     Gait: Gait is intact.      Deep Tendon Reflexes: Reflexes are normal and symmetric.     Comments: Stutter  Psychiatric:        Attention and Perception: Attention and perception normal.        Mood and Affect: Mood and affect normal.        Speech: Speech normal.        Behavior: Behavior normal. Behavior is cooperative.        Thought Content: Thought content normal.        Cognition and Memory: Cognition and memory normal.        Judgment: Judgment normal.     Results for orders placed or performed in visit on 01/15/22  ToxASSURE Select 13 (MW), Urine  Result Value Ref Range   Summary Note        Pertinent labs & imaging results that were available during my care of the patient were reviewed by me and considered in my medical decision making.  Assessment & Plan:  Kennis was seen today for medical management of chronic issues.  Diagnoses and all orders for this visit:  Primary hypertension BP well controlled. Changes were not made in regimen today. Goal BP is 130/80. Pt aware to report any persistent high or low readings. DASH diet and exercise encouraged. Exercise at least 150 minutes per week and increase as tolerated. Goal BMI > 25. Stress management encouraged. Avoid nicotine and tobacco product use. Avoid excessive alcohol and NSAID's. Avoid more than 2000 mg of sodium daily. Medications as prescribed. Follow up as scheduled.  -     CMP14+EGFR -     CBC with Differential/Platelet -     Lipid panel -     Thyroid Panel With TSH -     Microalbumin / creatinine urine ratio  Gastroesophageal reflux disease without esophagitis No red flags present. Diet discussed. Avoid fried, spicy, fatty, greasy, and acidic foods. Avoid caffeine, nicotine, and alcohol. Do not eat 2-3 hours before bedtime and stay upright for at least 1-2 hours after eating. Eat small frequent meals. Avoid  NSAID's like motrin and aleve. Medications as prescribed. Report any new or worsening symptoms. Follow up as discussed or sooner if  needed.   -     CBC with Differential/Platelet  Chronic idiopathic constipation Doing very well on Linzess, will continue  Obesity (BMI 30-39.9) Wegovy and Saxenda are still on back order and pt has not been able to start. Will send in [place]. Proper dosing along with diet and exercise discussed in detail. Follow up in 8 weeks for reevaluation.  -     CMP14+EGFR -     CBC with Differential/Platelet -     Lipid panel -     Thyroid Panel With TSH -     tirzepatide (ZEPBOUND) 2.5 MG/0.5ML Pen; Inject 2.5 mg into the skin once a week. -     tirzepatide (ZEPBOUND) 5 MG/0.5ML Pen; Inject 5 mg into the skin once a week. -     tirzepatide (ZEPBOUND) 7.5 MG/0.5ML Pen; Inject 7.5 mg into the skin once a week.     Continue all other maintenance medications.  Follow up plan: Return in about 8 weeks (around 06/02/2022), or if symptoms worsen or fail to improve, for BMI.   Continue healthy lifestyle choices, including diet (rich in fruits, vegetables, and lean proteins, and low in salt and simple carbohydrates) and exercise (at least 30 minutes of moderate physical activity daily).  Educational handout given for calorie counting for weight management  The above assessment and management plan was discussed with the patient. The patient verbalized understanding of and has agreed to the management plan. Patient is aware to call the clinic if they develop any new symptoms or if symptoms persist or worsen. Patient is aware when to return to the clinic for a follow-up visit. Patient educated on when it is appropriate to go to the emergency department.   Monia Pouch, FNP-C [facility] [phone]

## 2022-04-08 ENCOUNTER — Other Ambulatory Visit (HOSPITAL_COMMUNITY): Payer: Self-pay

## 2022-04-08 LAB — CMP14+EGFR
ALT: 21 IU/L (ref 0–44)
AST: 19 IU/L (ref 0–40)
Albumin/Globulin Ratio: 1.9 (ref 1.2–2.2)
Albumin: 4.5 g/dL (ref 3.8–4.9)
Alkaline Phosphatase: 115 IU/L (ref 44–121)
BUN/Creatinine Ratio: 11 (ref 9–20)
BUN: 11 mg/dL (ref 6–24)
Bilirubin Total: 0.4 mg/dL (ref 0.0–1.2)
CO2: 25 mmol/L (ref 20–29)
Calcium: 9.7 mg/dL (ref 8.7–10.2)
Chloride: 99 mmol/L (ref 96–106)
Creatinine, Ser: 1.02 mg/dL (ref 0.76–1.27)
Globulin, Total: 2.4 g/dL (ref 1.5–4.5)
Glucose: 72 mg/dL (ref 70–99)
Potassium: 4.6 mmol/L (ref 3.5–5.2)
Sodium: 140 mmol/L (ref 134–144)
Total Protein: 6.9 g/dL (ref 6.0–8.5)
eGFR: 85 mL/min/{1.73_m2} (ref >59)

## 2022-04-08 LAB — THYROID PANEL WITH TSH
Free Thyroxine Index: 1.7 (ref 1.2–4.9)
T3 Uptake Ratio: 23 % — ABNORMAL LOW (ref 24–39)
T4, Total: 7.3 ug/dL (ref 4.5–12.0)
TSH: 2.24 u[IU]/mL (ref 0.450–4.500)

## 2022-04-08 LAB — CBC WITH DIFFERENTIAL/PLATELET
Basophils Absolute: 0.1 10*3/uL (ref 0.0–0.2)
Basos: 1 %
EOS (ABSOLUTE): 0.1 10*3/uL (ref 0.0–0.4)
Eos: 2 %
Hematocrit: 44.8 % (ref 37.5–51.0)
Hemoglobin: 15 g/dL (ref 13.0–17.7)
Immature Grans (Abs): 0 10*3/uL (ref 0.0–0.1)
Immature Granulocytes: 0 %
Lymphocytes Absolute: 1.6 10*3/uL (ref 0.7–3.1)
Lymphs: 21 %
MCH: 29.9 pg (ref 26.6–33.0)
MCHC: 33.5 g/dL (ref 31.5–35.7)
MCV: 89 fL (ref 79–97)
Monocytes Absolute: 0.6 10*3/uL (ref 0.1–0.9)
Monocytes: 7 %
Neutrophils Absolute: 5.4 10*3/uL (ref 1.4–7.0)
Neutrophils: 69 %
Platelets: 225 10*3/uL (ref 150–450)
RBC: 5.02 x10E6/uL (ref 4.14–5.80)
RDW: 12.8 % (ref 11.6–15.4)
WBC: 7.9 10*3/uL (ref 3.4–10.8)

## 2022-04-08 LAB — LIPID PANEL
Chol/HDL Ratio: 5.3 ratio — ABNORMAL HIGH (ref 0.0–5.0)
Cholesterol, Total: 195 mg/dL (ref 100–199)
HDL: 37 mg/dL — ABNORMAL LOW (ref >39)
LDL Chol Calc (NIH): 112 mg/dL — ABNORMAL HIGH (ref 0–99)
Triglycerides: 265 mg/dL — ABNORMAL HIGH (ref 0–149)
VLDL Cholesterol Cal: 46 mg/dL — ABNORMAL HIGH (ref 5–40)

## 2022-04-08 LAB — MICROALBUMIN / CREATININE URINE RATIO
Creatinine, Urine: 90.9 mg/dL
Microalb/Creat Ratio: 5 mg/g creat (ref 0–29)
Microalbumin, Urine: 4.4 ug/mL

## 2022-04-08 NOTE — Telephone Encounter (Signed)
Name from pharmacy: ZEPBOUND 2.5 MG/0.5 ML PEN   Pharmacy comment: Alternative Requested:DRUG NOT COVERED   Name from pharmacy: ZEPBOUND 7.5 MG/0.5 ML PEN   Pharmacy comment: Alternative Requested:DRUG NOT COVERED; PLEASE CONTACT INSURANCE OR CONSIDER ALTERNATIVE.

## 2022-04-08 NOTE — Telephone Encounter (Signed)
Patient Advocate Encounter   Received notification from [employer] that prior authorization for Zepbound 2.5mg /0.63ml is required.   PA submitted on 04/08/2022 Key [id number] Status is pending

## 2022-04-09 NOTE — Telephone Encounter (Signed)
Pharmacy Patient Advocate Encounter  Received notification from [facility] that the request for prior authorization for ZEPBOUND 2.5MG  INJ has been denied due to .    How would you like to proceed?  Please be advised appeals may take up to 5 business days to be submitted as pharmacist prepares necessary documentation.  Thank you!

## 2022-04-16 NOTE — Telephone Encounter (Signed)
Spouse aware of denial and asking if provider can appeal. Please call back

## 2022-04-17 ENCOUNTER — Encounter: Payer: Self-pay | Admitting: Pharmacist

## 2022-04-17 NOTE — Telephone Encounter (Signed)
Patient notified of denial via my chart portal

## 2022-04-17 NOTE — Telephone Encounter (Signed)
His insurance plan does not cover anti obesity It's a true plan coverage exclusion Appeal will not work in this case

## 2022-07-23 ENCOUNTER — Other Ambulatory Visit: Payer: Self-pay

## 2022-07-23 ENCOUNTER — Emergency Department (HOSPITAL_BASED_OUTPATIENT_CLINIC_OR_DEPARTMENT_OTHER)
Admission: EM | Admit: 2022-07-23 | Discharge: 2022-07-23 | Disposition: A | Discharge status: Home / Self Care | Payer: 59 | Attending: Emergency Medicine | Admitting: Emergency Medicine

## 2022-07-23 ENCOUNTER — Emergency Department (HOSPITAL_BASED_OUTPATIENT_CLINIC_OR_DEPARTMENT_OTHER): Payer: 59 | Admitting: Radiology

## 2022-07-23 DIAGNOSIS — I10 Essential (primary) hypertension: Secondary | ICD-10-CM | POA: Diagnosis not present

## 2022-07-23 DIAGNOSIS — R079 Chest pain, unspecified: Secondary | ICD-10-CM

## 2022-07-23 LAB — COMPREHENSIVE METABOLIC PANEL
ALT: 32 U/L (ref 0–44)
AST: 41 U/L (ref 15–41)
Albumin: 4.3 g/dL (ref 3.5–5.0)
Alkaline Phosphatase: 84 U/L (ref 38–126)
Anion gap: 8 (ref 5–15)
BUN: 13 mg/dL (ref 6–20)
CO2: 27 mmol/L (ref 22–32)
Calcium: 9.2 mg/dL (ref 8.9–10.3)
Chloride: 105 mmol/L (ref 98–111)
Creatinine, Ser: 1.21 mg/dL (ref 0.61–1.24)
GFR, Estimated: >60 mL/min (ref >60)
Glucose, Bld: 122 mg/dL — ABNORMAL HIGH (ref 70–99)
Potassium: 4 mmol/L (ref 3.5–5.1)
Sodium: 140 mmol/L (ref 135–145)
Total Bilirubin: 0.6 mg/dL (ref 0.3–1.2)
Total Protein: 6.8 g/dL (ref 6.5–8.1)

## 2022-07-23 LAB — CBC
HCT: 45.1 % (ref 39.0–52.0)
Hemoglobin: 14.9 g/dL (ref 13.0–17.0)
MCH: 29.4 pg (ref 26.0–34.0)
MCHC: 33 g/dL (ref 30.0–36.0)
MCV: 89 fL (ref 80.0–100.0)
Platelets: 190 10*3/uL (ref 150–400)
RBC: 5.07 MIL/uL (ref 4.22–5.81)
RDW: 12.5 % (ref 11.5–15.5)
WBC: 6.9 10*3/uL (ref 4.0–10.5)
nRBC: 0 % (ref 0.0–0.2)

## 2022-07-23 LAB — TROPONIN I (HIGH SENSITIVITY)
Troponin I (High Sensitivity): 3 ng/L (ref <18)
Troponin I (High Sensitivity): 3 ng/L (ref <18)

## 2022-07-23 LAB — LIPASE, BLOOD: Lipase: 38 U/L (ref 11–51)

## 2022-07-23 MED ORDER — ASPIRIN 325 MG PO TABS: 325.0000 mg | ORAL_TABLET | Freq: Every day | ORAL | Status: DC

## 2022-07-23 MED ORDER — MORPHINE SULFATE (PF) 2 MG/ML IV SOLN
2.0000 mg | Freq: Once | INTRAVENOUS | Status: AC
  Administered 2022-07-23: 2 mg via INTRAVENOUS
  Filled 2022-07-23: qty 1

## 2022-07-23 MED ORDER — MORPHINE SULFATE (PF) 4 MG/ML IV SOLN
4.0000 mg | Freq: Once | INTRAVENOUS | Status: AC
  Administered 2022-07-23: 4 mg via INTRAVENOUS
  Filled 2022-07-23: qty 1

## 2022-07-23 MED ORDER — ALUM & MAG HYDROXIDE-SIMETH 200-200-20 MG/5ML PO SUSP
30.0000 mL | Freq: Once | ORAL | Status: AC
  Administered 2022-07-23: 30 mL via ORAL
  Filled 2022-07-23: qty 30

## 2022-07-23 MED ORDER — ONDANSETRON HCL 4 MG/2ML IJ SOLN
4.0000 mg | Freq: Once | INTRAMUSCULAR | Status: AC
  Administered 2022-07-23: 4 mg via INTRAVENOUS
  Filled 2022-07-23: qty 2

## 2022-07-23 MED ORDER — SUCRALFATE 1 G PO TABS: 1.0000 g | ORAL_TABLET | Freq: Four times a day (QID) | ORAL | 0 refills | Status: DC | PRN

## 2022-07-23 MED ORDER — FAMOTIDINE IN NACL 20-0.9 MG/50ML-% IV SOLN
20.0000 mg | Freq: Once | INTRAVENOUS | Status: AC
  Administered 2022-07-23: 20 mg via INTRAVENOUS
  Filled 2022-07-23: qty 50

## 2022-07-23 NOTE — ED Triage Notes (Signed)
Reports epigastric pain with onset 0000.

## 2022-07-23 NOTE — ED Provider Notes (Signed)
[facility] Emergency Department Provider Note MRN:  [id number]  Arrival date & time: 07/23/22     Chief Complaint   Chest Pain   History of Present Illness   Austin Curtis is a 57 y.o. year-old male with a history of hypertension presenting to the ED with chief complaint of chest pain.  Pain in the lower chest and upper abdomen starting at midnight.  Sudden onset described as a pressure.  No radiation, no nausea or vomiting, no dizziness diaphoresis, no shortness of breath, no leg pain or swelling.  Review of Systems  A thorough review of systems was obtained and all systems are negative except as noted in the HPI and PMH.   Patient's Health History    Past Medical History:  Diagnosis Date   Blood in stool    Constipation    Essential hypertension 07/05/2020   Hyperuricemia 04/23/2013   Kidney stones     Past Surgical History:  Procedure Laterality Date   NO PAST SURGERIES      Family History  Problem Relation Age of Onset   Bladder Cancer Father    Early death Sister        MVA at 78 years of age   Colon cancer Neg Hx     Social History   Socioeconomic History   Marital status: Married    Spouse name: Not on file   Number of children: Not on file   Years of education: Not on file   Highest education level: Not on file  Occupational History   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[employer]  Tobacco Use   Smoking status: Never   Smokeless tobacco: Never  Vaping Use   Vaping Use: Never used  Substance and Sexual Activity   Alcohol use: No    Alcohol/week: 0.0 standard drinks of alcohol   Drug use: No   Sexual activity: Not on file  Other Topics Concern   Not on file  Social History Narrative   Not on file   Social Determinants of Health   Financial Resource Strain: Not on file  Food Insecurity: Not on file  Transportation Needs: Not on file  Physical Activity: Not on file  Stress: Not on file  Social Connections: Not on  file  Intimate Partner Violence: Not on file     Physical Exam   Vitals:   07/23/22 0245 07/23/22 0300  BP: 131/80 132/67  Pulse: 72 73  Resp: 13 13  Temp:    SpO2: 96% 97%    CONSTITUTIONAL: Well-appearing, NAD NEURO/PSYCH:  Alert and oriented x 3, no focal deficits EYES:  eyes equal and reactive ENT/NECK:  no LAD, no JVD CARDIO: Regular rate, well-perfused, normal S1 and S2 PULM:  CTAB no wheezing or rhonchi GI/GU:  non-distended, non-tender MSK/SPINE:  No gross deformities, no edema SKIN:  no rash, atraumatic   *Additional and/or pertinent findings included in MDM below  Diagnostic and Interventional Summary    EKG Interpretation  Date/Time:  Thursday July 23 2022 00:50:14 EDT Ventricular Rate:  89 PR Interval:  121 QRS Duration: 94 QT Interval:  354 QTC Calculation: 431 R Axis:   56 Text Interpretation: Sinus rhythm Confirmed by Kennis Carina [phone]) on 07/23/2022 12:58:33 AM       Labs Reviewed  COMPREHENSIVE METABOLIC PANEL - Abnormal; Notable for the following components:      Result Value   Glucose, Bld 122 (*)    All other components within normal limits  CBC  LIPASE, BLOOD  TROPONIN I (HIGH SENSITIVITY)  TROPONIN I (HIGH SENSITIVITY)    DG Chest 2 View  Final Result      Medications  aspirin tablet 325 mg (has no administration in time range)  famotidine (PEPCID) IVPB 20 mg premix (0 mg Intravenous Stopped 07/23/22 0151)  ondansetron (ZOFRAN) injection 4 mg (4 mg Intravenous Given 07/23/22 0119)  morphine (PF) 2 MG/ML injection 2 mg (2 mg Intravenous Given 07/23/22 0121)  alum & mag hydroxide-simeth (MAALOX/MYLANTA) 200-200-20 MG/5ML suspension 30 mL (30 mLs Oral Given 07/23/22 0220)  morphine (PF) 4 MG/ML injection 4 mg (4 mg Intravenous Given 07/23/22 0221)     Procedures  /  Critical Care Procedures  ED Course and Medical Decision Making  Initial Impression and Ddx ACS is considered.  GERD is also considered.  Other considerations include  pancreatitis, gastritis, doubt PE or dissection.  Past medical/surgical history that increases complexity of ED encounter: GERD  Interpretation of Diagnostics I personally reviewed the EKG and my interpretation is as follows: Sinus rhythm without concerning ischemic findings  Labs reassuring with no significant blood count or electrolyte disturbance.  Chest x-ray normal  Patient Reassessment and Ultimate Disposition/Management     Troponin is negative x 2.  Patient experienced immediate relief with the Maalox GI cocktail.  And so given this reassuring workup, patient with no pain, heart score of 3, patient is appropriate for discharge.  Patient management required discussion with the following services or consulting groups:  None  Complexity of Problems Addressed Acute illness or injury that poses threat of life of bodily function  Additional Data Reviewed and Analyzed Further history obtained from: Further history from spouse/family member  Additional Factors Impacting ED Encounter Risk Prescriptions and Consideration of hospitalization  Elmer Sow. Pilar Plate, MD [facility] Emergency [facility] [email or web address]  Final Clinical Impressions(s) / ED Diagnoses     ICD-10-CM   1. Chest pain, unspecified type  R07.9       ED Discharge Orders          Ordered    sucralfate (CARAFATE) 1 g tablet  4 times daily PRN        07/23/22 0354             Discharge Instructions Discussed with and Provided to Patient:    Discharge Instructions      You were evaluated in the Emergency Department and after careful evaluation, we did not find any emergent condition requiring admission or further testing in the hospital.  Your exam/testing today was overall reassuring.  Symptoms may be due to acid reflux.  Restart your pantoprazole medication and take it every day.  Use the Carafate as needed for immediate relief.  Recommend close follow-up with your  primary care doctor to discuss your symptoms.  Please return to the Emergency Department if you experience any worsening of your condition.  Thank you for allowing [place] to be a part of your care.       Sabas Sous, MD 07/23/22 [phone]

## 2022-07-23 NOTE — Discharge Instructions (Signed)
You were evaluated in the Emergency Department and after careful evaluation, we did not find any emergent condition requiring admission or further testing in the hospital.  Your exam/testing today was overall reassuring.  Symptoms may be due to acid reflux.  Restart your pantoprazole medication and take it every day.  Use the Carafate as needed for immediate relief.  Recommend close follow-up with your primary care doctor to discuss your symptoms.  Please return to the Emergency Department if you experience any worsening of your condition.  Thank you for allowing [place] to be a part of your care.

## 2022-07-24 ENCOUNTER — Telehealth: Payer: Self-pay

## 2022-07-24 NOTE — Transitions of Care (Post Inpatient/ED Visit) (Signed)
   07/24/2022  Name: Austin Curtis MRN: [id number] DOB: 05/24/1965  Today's TOC FU Call Status: Today's TOC FU Call Status:: Successful TOC FU Call Competed TOC FU Call Complete Date: 07/24/22  Transition Care Management Follow-up Telephone Call Discharge Facility: [facility] ([facility] Type of Discharge: Emergency Department Reason for ED Visit: Cardiac Conditions Cardiac Conditions Diagnosis: Chest Pain Persisting How have you been since you were released from the hospital?: Better Any questions or concerns?: No  Items Reviewed: Did you receive and understand the discharge instructions provided?: Yes Medications obtained and verified?: Yes (Medications Reviewed) Any new allergies since your discharge?: No Dietary orders reviewed?: Yes Do you have support at home?: Yes People in Home: spouse  Home Care and Equipment/Supplies: Were Home Health Services Ordered?: NA Any new equipment or medical supplies ordered?: NA  Functional Questionnaire: Do you need assistance with bathing/showering or dressing?: No Do you need assistance with meal preparation?: No Do you need assistance with eating?: No Do you have difficulty maintaining continence: No Do you need assistance with getting out of bed/getting out of a chair/moving?: No Do you have difficulty managing or taking your medications?: No  Follow up appointments reviewed: PCP Follow-up appointment confirmed?: NA Specialist Hospital Follow-up appointment confirmed?: NA Do you need transportation to your follow-up appointment?: No Do you understand care options if your condition(s) worsen?: Yes-patient verbalized understanding   SIGNATURE Karena Addison, LPN [facility] Nurse Health Advisor Direct Dial [phone]

## 2023-06-04 ENCOUNTER — Ambulatory Visit: Payer: Self-pay | Admitting: Family Medicine

## 2023-06-04 NOTE — Telephone Encounter (Signed)
 Copied from CRM [id number]. Topic: Clinical - Red Word Triage >> Jun 04, 2023 11:25 AM Nyra Capes wrote: Red Word that prompted transfer to Nurse Triage:  Patient wife Lupita Leash calling in. Patient has chest congestion and wheezing started today. Patient gets pneumonia easily. Good call back # [phone]   Chief Complaint: Cough with wheezing Symptoms: Cough, wheezing, mild shortness of breath  Frequency: Frequent  Pertinent Negatives: Patient denies sputum production, fever Disposition: [] ED /[x] Urgent Care (no appt availability in office) / [] Appointment(In office/virtual)/ []  [facility]/ [] Home Care/ [] Refused Recommended Disposition /[] [facility]/ []  Follow-up with PCP Additional Notes: Patient reports he has had a cough for the last couple of days. He states his chest feels congested and reports that today he developed wheezing. He reports also experiencing mild shortness of breath. Patient denies any fever or sputum production. Patient advised to go to urgent care due to no available appointments nearby for him today. Patient understood and is in agreement with this plan.    Reason for Disposition  [1] MILD difficulty breathing (e.g., minimal/no SOB at rest, SOB with walking, pulse <100) AND [2] still present when not coughing  Answer Assessment - Initial Assessment Questions 1. ONSET: "When did the cough begin?"      A couple of days  2. SEVERITY: "How bad is the cough today?"      Moderate to severe  3. SPUTUM: "Describe the color of your sputum" (none, dry cough; clear, white, yellow, green)     No 4. HEMOPTYSIS: "Are you coughing up any blood?" If so ask: "How much?" (flecks, streaks, tablespoons, etc.)     No 5. DIFFICULTY BREATHING: "Are you having difficulty breathing?" If Yes, ask: "How bad is it?" (e.g., mild, moderate, severe)    - MILD: No SOB at rest, mild SOB with walking, speaks normally in sentences, can lie down, no retractions, pulse < 100.    -  MODERATE: SOB at rest, SOB with minimal exertion and prefers to sit, cannot lie down flat, speaks in phrases, mild retractions, audible wheezing, pulse 100-120.    - SEVERE: Very SOB at rest, speaks in single words, struggling to breathe, sitting hunched forward, retractions, pulse > 120      Mild 6. FEVER: "Do you have a fever?" If Yes, ask: "What is your temperature, how was it measured, and when did it start?"     No 7. CARDIAC HISTORY: "Do you have any history of heart disease?" (e.g., heart attack, congestive heart failure)      Hypertension  8. LUNG HISTORY: "Do you have any history of lung disease?"  (e.g., pulmonary embolus, asthma, emphysema)     No 9. PE RISK FACTORS: "Do you have a history of blood clots?" (or: recent major surgery, recent prolonged travel, bedridden)     No 10. OTHER SYMPTOMS: "Do you have any other symptoms?" (e.g., runny nose, wheezing, chest pain)       Wheezing, chest congestion  Protocols used: Cough - Acute Non-Productive-A-AH

## 2023-06-04 NOTE — Telephone Encounter (Addendum)
 This RN contacted patient's wife Lupita Leash about patient having complaints of chest congestion. Patient's wife states that they were already triaged and heading to UC for symptoms. Copied from CRM [id number]. Topic: Clinical - Red Word Triage >> Jun 04, 2023 11:25 AM Nyra Capes wrote: Red Word that prompted transfer to Nurse Triage:  Patient wife Lupita Leash calling in. Patient has chest congestion and wheezing started today. Patient gets pneumonia easily. Good call back # [phone]

## 2023-06-09 ENCOUNTER — Other Ambulatory Visit: Payer: Self-pay | Admitting: Family Medicine

## 2023-06-09 DIAGNOSIS — K219 Gastro-esophageal reflux disease without esophagitis: Secondary | ICD-10-CM

## 2023-06-09 MED ORDER — PANTOPRAZOLE SODIUM 20 MG PO TBEC
20.0000 mg | DELAYED_RELEASE_TABLET | Freq: Two times a day (BID) | ORAL | 0 refills | Status: DC
Start: 2023-06-09 — End: 2023-09-06

## 2023-06-09 NOTE — Addendum Note (Signed)
 Addended by: Julious Payer D on: 06/09/2023 10:02 AM   Modules accepted: Orders

## 2023-06-09 NOTE — Telephone Encounter (Signed)
 Marcelino Duster NTBS last OV 04/07/22 NO RF sent to pharmacy last OV greater than a year

## 2023-06-09 NOTE — Telephone Encounter (Signed)
 Schedule march 13 / He wanted 330 appt

## 2023-06-17 ENCOUNTER — Ambulatory Visit: Admitting: Family Medicine

## 2023-06-19 ENCOUNTER — Other Ambulatory Visit: Payer: Self-pay | Admitting: Family Medicine

## 2023-06-19 DIAGNOSIS — I10 Essential (primary) hypertension: Secondary | ICD-10-CM

## 2023-06-24 ENCOUNTER — Ambulatory Visit: Admitting: Family Medicine

## 2023-06-30 ENCOUNTER — Other Ambulatory Visit: Payer: Self-pay | Admitting: Family Medicine

## 2023-06-30 ENCOUNTER — Ambulatory Visit: Admitting: Family Medicine

## 2023-06-30 VITALS — BP 128/76 | HR 90 | Temp 97.8°F | Ht 72.0 in | Wt 277.8 lb

## 2023-06-30 DIAGNOSIS — K5904 Chronic idiopathic constipation: Secondary | ICD-10-CM | POA: Diagnosis not present

## 2023-06-30 DIAGNOSIS — R7309 Other abnormal glucose: Secondary | ICD-10-CM | POA: Diagnosis not present

## 2023-06-30 DIAGNOSIS — K219 Gastro-esophageal reflux disease without esophagitis: Secondary | ICD-10-CM

## 2023-06-30 DIAGNOSIS — I10 Essential (primary) hypertension: Secondary | ICD-10-CM | POA: Diagnosis not present

## 2023-06-30 DIAGNOSIS — E669 Obesity, unspecified: Secondary | ICD-10-CM | POA: Diagnosis not present

## 2023-06-30 DIAGNOSIS — R635 Abnormal weight gain: Secondary | ICD-10-CM

## 2023-06-30 LAB — BAYER DCA HB A1C WAIVED: HB A1C (BAYER DCA - WAIVED): 5.2 % (ref 4.8–5.6)

## 2023-06-30 MED ORDER — ZEPBOUND 2.5 MG/0.5ML ~~LOC~~ SOAJ
2.5000 mg | SUBCUTANEOUS | 0 refills | Status: DC
Start: 2023-06-30 — End: 2023-10-07

## 2023-06-30 MED ORDER — LINACLOTIDE 72 MCG PO CAPS
72.0000 ug | ORAL_CAPSULE | Freq: Every day | ORAL | 3 refills | Status: DC
Start: 2023-06-30 — End: 2023-10-07

## 2023-06-30 MED ORDER — LOSARTAN POTASSIUM 50 MG PO TABS
50.0000 mg | ORAL_TABLET | Freq: Every day | ORAL | 3 refills | Status: AC
Start: 2023-06-30 — End: ?

## 2023-06-30 NOTE — Patient Instructions (Signed)

## 2023-06-30 NOTE — Progress Notes (Signed)
 Subjective:  Patient ID: Austin Curtis, male    DOB: 1965/09/17, 58 y.o.   MRN: [id number]  Patient Care Team: Sonny Masters, FNP as PCP - General (Family Medicine) Valetta Fuller, Brand Males, NP (Inactive) as Nurse Practitioner (Internal Medicine)   Chief Complaint:  BMI   HPI: Austin Curtis is a 58 y.o. male presenting on 06/30/2023 for BMI   Discussed the use of AI scribe software for clinical note transcription with the patient, who gave verbal consent to proceed.  History of Present Illness   Austin Curtis is a 58 year old male who presents for weight management and medication review.  He has not been taking any weight management medications since 2023, including Zepbound, due to insurance coverage issues. He has previously tried medications such as [place] without success in obtaining approval. His current weight is 277 pounds, which has increased from 242 pounds since his last visit. No changes in eating habits or stress levels have been noted.  He experiences increased discomfort in his hips and knees, which he attributes to his weight gain. He also feels increased fatigue, which he associates with his excess weight. He maintains a routine of walking 45 minutes daily for exercise.  His glucose level was previously elevated at 122 mg/dL, and he has a family history of type 2 diabetes, with his mother being affected.  He is currently taking losartan for blood pressure, which was recently refilled, and Protonix for reflux, which is effective without any voice changes. He no longer uses Carafate for reflux. He continues to take Linzess 72 micrograms for constipation, which is effective in maintaining regular bowel movements without significant side effects.  No history of sleep apnea, high cholesterol, thyroid disease, or chest pain. No night sweats, leg swelling, headaches, voice changes, blood in stool, or significant abdominal cramps.          Relevant past medical,  surgical, family, and social history reviewed and updated as indicated.  Allergies and medications reviewed and updated. Data reviewed: Chart in Epic.   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[employer]  Tobacco Use   Smoking status: Never   Smokeless tobacco: Never  Vaping Use   Vaping status: Never Used  Substance and Sexual Activity   Alcohol use: No    Alcohol/week: 0.0 standard drinks of alcohol   Drug use: No   Sexual activity: Not on file  Other Topics Concern   Not on file  Social History Narrative   Not on file   Social Drivers of Health   Financial Resource Strain: Low Risk  (06/30/2023)   Overall Financial Resource Strain (CARDIA)    Difficulty of Paying Living Expenses: Not very hard  Food Insecurity: No Food Insecurity (06/30/2023)   Hunger Vital Sign    Worried About Running Out of Food in the Last Year: Never true    Ran Out of Food in  the Last Year: Never true  Transportation Needs: No Transportation Needs (06/30/2023)   PRAPARE - Administrator, Civil Service (Medical): No    Lack of Transportation (Non-Medical): No  Physical Activity: Insufficiently Active (06/30/2023)   Exercise Vital Sign    Days of Exercise per Week: 3 days    Minutes of Exercise per Session: 30 min  Stress: No Stress Concern Present (06/30/2023)   [employer] of Occupational Health - Occupational Stress Questionnaire    Feeling of Stress : Only a little  Social Connections: Moderately Integrated (06/30/2023)    Social Connection and Isolation Panel [NHANES]    Frequency of Communication with Friends and Family: Three times a week    Frequency of Social Gatherings with Friends and Family: Twice a week    Attends Religious Services: More than 4 times per year    Active Member of [employer] or Organizations: No    Attends Banker Meetings: Not on file    Marital Status: Married  Intimate Partner Violence: Not on file    Outpatient Encounter Medications as of 06/30/2023  Medication Sig   pantoprazole (PROTONIX) 20 MG tablet Take 1 tablet (20 mg total) by mouth 2 (two) times daily before a meal.   tirzepatide (ZEPBOUND) 2.5 MG/0.5ML Pen Inject 2.5 mg into the skin once a week.   [DISCONTINUED] linaclotide (LINZESS) 72 MCG capsule Take 1 capsule (72 mcg total) by mouth daily before breakfast.   [DISCONTINUED] losartan (COZAAR) 50 MG tablet TAKE 1 TABLET BY MOUTH EVERY DAY   [DISCONTINUED] sucralfate (CARAFATE) 1 g tablet Take 1 tablet (1 g total) by mouth 4 (four) times daily as needed.   [DISCONTINUED] tirzepatide (ZEPBOUND) 2.5 MG/0.5ML Pen Inject 2.5 mg into the skin once a week.   [DISCONTINUED] tirzepatide (ZEPBOUND) 5 MG/0.5ML Pen Inject 5 mg into the skin once a week.   [DISCONTINUED] tirzepatide (ZEPBOUND) 7.5 MG/0.5ML Pen Inject 7.5 mg into the skin once a week.   linaclotide (LINZESS) 72 MCG capsule Take 1 capsule (72 mcg total) by mouth daily before breakfast.   losartan (COZAAR) 50 MG tablet Take 1 tablet (50 mg total) by mouth daily.   No facility-administered encounter medications on file as of 06/30/2023.    Allergies  Allergen Reactions   Penicillins Anaphylaxis   Lisinopril Cough    Pertinent ROS per HPI, otherwise unremarkable      Objective:  BP 128/76   Pulse 90   Temp 97.8 F (36.6 C)   Ht 6' (1.829 m)   Wt 277 lb 12.8 oz (126 kg)   SpO2 95%   BMI 37.68 kg/m    Wt Readings from Last 3 Encounters:  06/30/23 277 lb 12.8 oz (126 kg)  07/23/22 235 lb (106.6  kg)  04/07/22 242 lb (109.8 kg)    Physical Exam Vitals and nursing note reviewed.  Constitutional:      General: He is not in acute distress.    Appearance: Normal appearance. He is well-developed and well-groomed. He is obese. He is not ill-appearing, toxic-appearing or diaphoretic.  HENT:     Head: Normocephalic and atraumatic.     Jaw: There is normal jaw occlusion.     Right Ear: Hearing normal.     Left Ear: Hearing normal.     Nose: Nose normal.     Mouth/Throat:     Lips: Pink.     Mouth: Mucous membranes are moist.     Pharynx: Oropharynx is clear. Uvula midline.  Eyes:     General: Lids are normal.     Conjunctiva/sclera: Conjunctivae normal.     Pupils: Pupils are equal, round, and reactive to light.  Neck:     Thyroid: No thyroid mass, thyromegaly or thyroid tenderness.     Vascular: No carotid bruit or JVD.     Trachea: Trachea and phonation normal.  Cardiovascular:     Rate and Rhythm: Normal rate and regular rhythm.     Chest Wall: PMI is not displaced.     Pulses: Normal pulses.     Heart sounds: Normal heart sounds. No murmur heard.    No friction rub. No gallop.  Pulmonary:     Effort: Pulmonary effort is normal. No respiratory distress.     Breath sounds: Normal breath sounds. No wheezing.  Abdominal:     General: Bowel sounds are normal.     Palpations: Abdomen is soft.  Musculoskeletal:        General: Normal range of motion.     Cervical back: Normal range of motion and neck supple.     Right lower leg: No edema.     Left lower leg: No edema.  Lymphadenopathy:     Cervical: No cervical adenopathy.  Skin:    General: Skin is warm and dry.     Capillary Refill: Capillary refill takes less than 2 seconds.     Coloration: Skin is not cyanotic, jaundiced or pale.     Findings: No rash.  Neurological:     General: No focal deficit present.     Mental Status: He is alert and oriented to person, place, and time.     Sensory: Sensation is intact.      Motor: Motor function is intact.     Coordination: Coordination is intact.     Gait: Gait is intact.     Deep Tendon Reflexes: Reflexes are normal and symmetric.  Psychiatric:        Attention and Perception: Attention and perception normal.        Mood and Affect: Mood and affect normal.        Speech: Speech normal.        Behavior: Behavior normal. Behavior is cooperative.        Thought Content: Thought content normal.        Cognition and Memory: Cognition and memory normal.        Judgment: Judgment normal.      Results for orders placed or performed during the hospital encounter of 07/23/22  CBC   Collection Time: 07/23/22  1:10 AM  Result Value Ref Range   WBC 6.9 4.0 - 10.5 K/uL   RBC 5.07 4.22 - 5.81 MIL/uL   Hemoglobin 14.9 13.0 - 17.0 g/dL   HCT 16.1 09.6 - 04.5 %   MCV 89.0 80.0 - 100.0 fL   MCH 29.4 26.0 - 34.0 pg   MCHC 33.0 30.0 - 36.0 g/dL   RDW 40.9 81.1 - 91.4 %   Platelets 190 150 - 400 K/uL   nRBC 0.0 0.0 - 0.2 %  Comprehensive metabolic panel   Collection Time: 07/23/22  1:10 AM  Result Value Ref Range   Sodium 140 135 - 145 mmol/L   Potassium 4.0 3.5 - 5.1 mmol/L   Chloride 105 98 - 111 mmol/L   CO2 27 22 - 32 mmol/L   Glucose, Bld 122 (H) 70 - 99 mg/dL   BUN 13 6 - 20 mg/dL   Creatinine, Ser  1.21 0.61 - 1.24 mg/dL   Calcium 9.2 8.9 - 16.1 mg/dL   Total Protein 6.8 6.5 - 8.1 g/dL   Albumin 4.3 3.5 - 5.0 g/dL   AST 41 15 - 41 U/L   ALT 32 0 - 44 U/L   Alkaline Phosphatase 84 38 - 126 U/L   Total Bilirubin 0.6 0.3 - 1.2 mg/dL   GFR, Estimated >09 >60 mL/min   Anion gap 8 5 - 15  Lipase, blood   Collection Time: 07/23/22  1:10 AM  Result Value Ref Range   Lipase 38 11 - 51 U/L  Troponin I (High Sensitivity)   Collection Time: 07/23/22  1:10 AM  Result Value Ref Range   Troponin I (High Sensitivity) 3 <18 ng/L  Troponin I (High Sensitivity)   Collection Time: 07/23/22  3:09 AM  Result Value Ref Range   Troponin I (High Sensitivity) 3 <18  ng/L       Pertinent labs & imaging results that were available during my care of the patient were reviewed by me and considered in my medical decision making.  Assessment & Plan:  Arshdeep was seen today for bmi.  Diagnoses and all orders for this visit:  Primary hypertension -     CMP14+EGFR -     CBC with Differential/Platelet -     Thyroid Panel With TSH -     Lipid panel -     Bayer DCA Hb A1c Waived -     losartan (COZAAR) 50 MG tablet; Take 1 tablet (50 mg total) by mouth daily. -     tirzepatide (ZEPBOUND) 2.5 MG/0.5ML Pen; Inject 2.5 mg into the skin once a week.  Obesity (BMI 30-39.9) -     CMP14+EGFR -     CBC with Differential/Platelet -     Thyroid Panel With TSH -     Lipid panel -     VITAMIN D 25 Hydroxy (Vit-D Deficiency, Fractures) -     Vitamin B12 -     Bayer DCA Hb A1c Waived -     tirzepatide (ZEPBOUND) 2.5 MG/0.5ML Pen; Inject 2.5 mg into the skin once a week.  Weight gain -     CMP14+EGFR -     CBC with Differential/Platelet -     Thyroid Panel With TSH -     Lipid panel -     VITAMIN D 25 Hydroxy (Vit-D Deficiency, Fractures) -     Vitamin B12 -     Bayer DCA Hb A1c Waived  Elevated glucose -     Bayer DCA Hb A1c Waived -     tirzepatide (ZEPBOUND) 2.5 MG/0.5ML Pen; Inject 2.5 mg into the skin once a week.  Gastroesophageal reflux disease without esophagitis -     CBC with Differential/Platelet  Chronic idiopathic constipation -     linaclotide (LINZESS) 72 MCG capsule; Take 1 capsule (72 mcg total) by mouth daily before breakfast.     Assessment and Plan    Obesity Weight has increased to 277 lbs from 242 lbs since the last visit. He has not been on weight management medication since 2023 due to insurance issues with Zepbound. He exercises regularly by walking 45 minutes daily but reports no dietary or stress level changes. Hip and knee discomfort likely due to excess weight. Potential insurance coverage for Zepbound this year. - Attempt  to get Zepbound approved through insurance. - If approved, initiate at a lower dose and titrate up every four weeks. -  Encourage him to contact insurance regarding coverage for weight management medications.  Elevated Blood Glucose Previous glucose level at 122 mg/dL. Family history of type 2 diabetes (mother). Increased fatigue associated with excess weight. A1c test needed to assess glucose control and determine necessity for diabetes medication. - Order A1c test.  Hypertension Currently on losartan, which was recently refilled. Management appears effective. - Send a 90-day supply of losartan with refills.  Gastroesophageal Reflux Disease (GERD) On Protonix for reflux, doing well without voice changes. No longer uses Carafate for breakthrough reflux. - Continue Protonix as prescribed. - Monitor for need of Carafate and refill if necessary.  Constipation On Linzess 72 mcg with good bowel movements and no significant side effects such as blood in stool or abdominal cramps. - Refill Linzess prescription.          Continue all other maintenance medications.  Follow up plan: Return in about 3 months (around 09/30/2023) for chronic follow up, BMI.   Continue healthy lifestyle choices, including diet (rich in fruits, vegetables, and lean proteins, and low in salt and simple carbohydrates) and exercise (at least 30 minutes of moderate physical activity daily).  Educational handout given for GLP1 success tips  The above assessment and management plan was discussed with the patient. The patient verbalized understanding of and has agreed to the management plan. Patient is aware to call the clinic if they develop any new symptoms or if symptoms persist or worsen. Patient is aware when to return to the clinic for a follow-up visit. Patient educated on when it is appropriate to go to the emergency department.   Kari Baars, FNP-C [facility] [phone]

## 2023-07-01 LAB — CBC WITH DIFFERENTIAL/PLATELET
Basophils Absolute: 0 10*3/uL (ref 0.0–0.2)
Basos: 1 %
EOS (ABSOLUTE): 0.2 10*3/uL (ref 0.0–0.4)
Eos: 3 %
Hematocrit: 44.4 % (ref 37.5–51.0)
Hemoglobin: 14.6 g/dL (ref 13.0–17.7)
Immature Grans (Abs): 0 10*3/uL (ref 0.0–0.1)
Immature Granulocytes: 1 %
Lymphocytes Absolute: 1.3 10*3/uL (ref 0.7–3.1)
Lymphs: 20 %
MCH: 29.5 pg (ref 26.6–33.0)
MCHC: 32.9 g/dL (ref 31.5–35.7)
MCV: 90 fL (ref 79–97)
Monocytes Absolute: 0.5 10*3/uL (ref 0.1–0.9)
Monocytes: 8 %
Neutrophils Absolute: 4.4 10*3/uL (ref 1.4–7.0)
Neutrophils: 67 %
Platelets: 196 10*3/uL (ref 150–450)
RBC: 4.95 x10E6/uL (ref 4.14–5.80)
RDW: 12.5 % (ref 11.6–15.4)
WBC: 6.5 10*3/uL (ref 3.4–10.8)

## 2023-07-01 LAB — LIPID PANEL
Chol/HDL Ratio: 4.9 ratio (ref 0.0–5.0)
Cholesterol, Total: 146 mg/dL (ref 100–199)
HDL: 30 mg/dL — ABNORMAL LOW (ref >39)
LDL Chol Calc (NIH): 68 mg/dL (ref 0–99)
Triglycerides: 300 mg/dL — ABNORMAL HIGH (ref 0–149)
VLDL Cholesterol Cal: 48 mg/dL — ABNORMAL HIGH (ref 5–40)

## 2023-07-01 LAB — CMP14+EGFR
ALT: 30 IU/L (ref 0–44)
AST: 19 IU/L (ref 0–40)
Albumin: 4.2 g/dL (ref 3.8–4.9)
Alkaline Phosphatase: 107 IU/L (ref 44–121)
BUN/Creatinine Ratio: 14 (ref 9–20)
BUN: 14 mg/dL (ref 6–24)
Bilirubin Total: 0.3 mg/dL (ref 0.0–1.2)
CO2: 25 mmol/L (ref 20–29)
Calcium: 9.1 mg/dL (ref 8.7–10.2)
Chloride: 101 mmol/L (ref 96–106)
Creatinine, Ser: 1 mg/dL (ref 0.76–1.27)
Globulin, Total: 2.3 g/dL (ref 1.5–4.5)
Glucose: 80 mg/dL (ref 70–99)
Potassium: 4.4 mmol/L (ref 3.5–5.2)
Sodium: 140 mmol/L (ref 134–144)
Total Protein: 6.5 g/dL (ref 6.0–8.5)
eGFR: 87 mL/min/{1.73_m2} (ref >59)

## 2023-07-01 LAB — VITAMIN B12: Vitamin B-12: 494 pg/mL (ref 232–1245)

## 2023-07-01 LAB — THYROID PANEL WITH TSH
Free Thyroxine Index: 1.6 (ref 1.2–4.9)
T3 Uptake Ratio: 21 % — ABNORMAL LOW (ref 24–39)
T4, Total: 7.8 ug/dL (ref 4.5–12.0)
TSH: 1.03 u[IU]/mL (ref 0.450–4.500)

## 2023-07-01 LAB — VITAMIN D 25 HYDROXY (VIT D DEFICIENCY, FRACTURES): Vit D, 25-Hydroxy: 20.2 ng/mL — ABNORMAL LOW (ref 30.0–100.0)

## 2023-07-01 NOTE — Telephone Encounter (Signed)
 Name from pharmacy: ZEPBOUND 2.5 MG/0.5 ML PEN   Pharmacy comment: Alternative Requested:NOT COVERED BY INSURANCE.

## 2023-09-05 ENCOUNTER — Other Ambulatory Visit: Payer: Self-pay | Admitting: Family Medicine

## 2023-09-05 DIAGNOSIS — K219 Gastro-esophageal reflux disease without esophagitis: Secondary | ICD-10-CM

## 2023-10-07 ENCOUNTER — Ambulatory Visit: Admitting: Family Medicine

## 2023-10-07 ENCOUNTER — Encounter: Payer: Self-pay | Admitting: Family Medicine

## 2023-10-07 ENCOUNTER — Telehealth: Payer: Self-pay

## 2023-10-07 ENCOUNTER — Ambulatory Visit: Payer: Self-pay | Admitting: Family Medicine

## 2023-10-07 VITALS — BP 128/76 | HR 87 | Temp 97.5°F | Ht 73.0 in | Wt 273.6 lb

## 2023-10-07 DIAGNOSIS — K5904 Chronic idiopathic constipation: Secondary | ICD-10-CM

## 2023-10-07 DIAGNOSIS — E669 Obesity, unspecified: Secondary | ICD-10-CM

## 2023-10-07 DIAGNOSIS — I10 Essential (primary) hypertension: Secondary | ICD-10-CM

## 2023-10-07 DIAGNOSIS — K219 Gastro-esophageal reflux disease without esophagitis: Secondary | ICD-10-CM

## 2023-10-07 DIAGNOSIS — E782 Mixed hyperlipidemia: Secondary | ICD-10-CM

## 2023-10-07 LAB — LIPID PANEL

## 2023-10-07 MED ORDER — PHENTERMINE-TOPIRAMATE ER 7.5-46 MG PO CP24: 1.0000 | ORAL_CAPSULE | Freq: Every day | ORAL | 0 refills | Status: DC

## 2023-10-07 MED ORDER — PHENTERMINE-TOPIRAMATE ER 11.25-69 MG PO CP24: 1.0000 | ORAL_CAPSULE | Freq: Every day | ORAL | 0 refills | Status: DC

## 2023-10-07 MED ORDER — LINACLOTIDE 145 MCG PO CAPS
145.0000 ug | ORAL_CAPSULE | Freq: Every day | ORAL | 1 refills | Status: AC
Start: 2023-10-07 — End: ?

## 2023-10-07 MED ORDER — PHENTERMINE-TOPIRAMATE ER 3.75-23 MG PO CP24: 1.0000 | ORAL_CAPSULE | Freq: Every day | ORAL | 0 refills | Status: DC

## 2023-10-07 NOTE — Telephone Encounter (Signed)
 Pharmacy Patient Advocate Encounter   Received notification from CoverMyMeds that prior authorization for Phentermine -Topiramate  ER 3.75-23MG  er capsules is required/requested.   Insurance verification completed.   The patient is insured through [facility] .   Per test claim: PA required; PA submitted to above mentioned insurance via CoverMyMeds Key/confirmation #/EOC [id number] Status is pending

## 2023-10-07 NOTE — Progress Notes (Signed)
 Subjective:  Patient ID: Austin Curtis, male    DOB: 09-Nov-1965, 58 y.o.   MRN: [id number]  Patient Care Team: Severa Rock HERO, FNP as PCP - General (Family Medicine) Rosendo, Veva CROME, NP (Inactive) as Nurse Practitioner (Internal Medicine)   Chief Complaint:  Medical Management of Chronic Issues (3 month chronic check up )   HPI: Austin Curtis is a 58 y.o. male presenting on 10/07/2023 for Medical Management of Chronic Issues (3 month chronic check up )   Austin Curtis is a 58 year old male who presents for weight management and constipation.  Weight management difficulties - Persistent difficulty with weight loss despite adherence to diet and exercise regimen - Walks 2.5 miles six days per week - Consumes two meals per day with small portions - No significant weight loss despite these efforts - Unable to obtain insurance coverage for anti-obesity medications including Wegovy  and Zepbound   Constipation - Bowel movements occur only once or twice per week - Currently taking Linzess  72 mcg, with decreasing effectiveness - Maintains good fiber and water intake - Drinks only water and coffee, avoids juices and sodas  Blood pressure monitoring - Home blood pressure readings average 130/90 mmHg - Checks blood pressure periodically  Gastroesophageal reflux symptoms - Reflux symptoms well controlled with Protonix  - No changes in voice or cough - No blood in stool          Relevant past medical, surgical, family, and social history reviewed and updated as indicated.  Allergies and medications reviewed and updated. Data reviewed: Chart in Epic.   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[employer]  Tobacco Use   Smoking status: Never   Smokeless tobacco: Never  Vaping Use   Vaping status: Never Used  Substance and Sexual Activity   Alcohol use: No    Alcohol/week: 0.0 standard drinks of alcohol   Drug use: No   Sexual activity: Not on file  Other Topics Concern   Not on file  Social History Narrative   Not on file   Social Drivers of Health   Financial Resource Strain: Low Risk  (06/30/2023)   Overall Financial Resource Strain (CARDIA)    Difficulty of Paying Living Expenses: Not very hard  Food Insecurity: No Food Insecurity (06/30/2023)   Hunger Vital Sign    Worried About Running Out of Food in the Last Year: Never true    Ran Out of Food in the Last Year: Never true  Transportation Needs: No Transportation Needs (06/30/2023)   PRAPARE - Administrator, Civil Service (Medical): No    Lack of Transportation (Non-Medical): No  Physical Activity: Insufficiently Active (06/30/2023)   Exercise Vital Sign    Days of Exercise per Week: 3 days    Minutes of Exercise per Session: 30 min  Stress: No Stress Concern Present (06/30/2023)   [employer] of Occupational Health - Occupational Stress  Questionnaire    Feeling of Stress : Only a little  Social Connections: Moderately Integrated (06/30/2023)   Social Connection and Isolation Panel    Frequency of Communication with Friends and Family: Three times a week    Frequency of Social Gatherings with Friends and Family: Twice a week    Attends Religious Services: More than 4 times per year    Active Member of [employer] or Organizations: No    Attends Banker Meetings: Not on file    Marital Status: Married  Intimate Partner Violence: Not on file    Outpatient Encounter Medications as of 10/07/2023  Medication Sig    linaclotide  (LINZESS ) 145 MCG CAPS capsule Take 1 capsule (145 mcg total) by mouth daily before breakfast.   losartan  (COZAAR ) 50 MG tablet Take 1 tablet (50 mg total) by mouth daily.   pantoprazole  (PROTONIX ) 20 MG tablet TAKE 1 TABLET (20 MG TOTAL) BY MOUTH 2 (TWO) TIMES DAILY BEFORE A MEAL.   [START ON 12/06/2023] Phentermine -Topiramate  ER 11.25-69 MG CP24 Take 1 capsule by mouth daily.   Phentermine -Topiramate  ER 3.75-23 MG CP24 Take 1 capsule by mouth daily.   [START ON 11/06/2023] Phentermine -Topiramate  ER 7.5-46 MG CP24 Take 1 capsule by mouth daily.   [DISCONTINUED] linaclotide  (LINZESS ) 72 MCG capsule Take 1 capsule (72 mcg total) by mouth daily before breakfast.   [DISCONTINUED] tirzepatide  (ZEPBOUND ) 2.5 MG/0.5ML Pen Inject 2.5 mg into the skin once a week.   No facility-administered encounter medications on file as of 10/07/2023.    Allergies  Allergen Reactions   Penicillins Anaphylaxis   Lisinopril  Cough    Pertinent ROS per HPI, otherwise unremarkable      Objective:  BP 128/76   Pulse 87   Temp (!) 97.5 F (36.4 C)   Ht 6' 1 (1.854 m)   Wt 273 lb 9.6 oz (124.1 kg)   SpO2 94%   BMI 36.10 kg/m    Wt Readings from Last 3 Encounters:  10/07/23 273 lb 9.6 oz (124.1 kg)  06/30/23 277 lb 12.8 oz (126 kg)  07/23/22 235 lb (106.6 kg)    Physical Exam Vitals and nursing note reviewed.  Constitutional:      General: He is not in acute distress.    Appearance: Normal appearance. He is well-developed and well-groomed. He is obese. He is not ill-appearing, toxic-appearing or diaphoretic.  HENT:     Head: Normocephalic and atraumatic.     Jaw: There is normal jaw occlusion.     Right Ear: Hearing normal.     Left Ear: Hearing normal.     Nose: Nose normal.     Mouth/Throat:     Lips: Pink.     Mouth: Mucous membranes are moist.     Pharynx: Oropharynx is clear. Uvula midline.  Eyes:     General: Lids are normal.     Extraocular Movements: Extraocular movements  intact.     Conjunctiva/sclera: Conjunctivae normal.     Pupils: Pupils are equal, round, and reactive to light.  Neck:     Thyroid : No thyroid  mass, thyromegaly or thyroid  tenderness.     Vascular: No carotid bruit or JVD.     Trachea: Trachea and phonation normal.  Cardiovascular:     Rate and Rhythm: Normal rate and regular rhythm.     Chest Wall: PMI is not displaced.     Pulses: Normal pulses.     Heart sounds: Normal heart sounds. No murmur heard.    No friction  rub. No gallop.  Pulmonary:     Effort: Pulmonary effort is normal. No respiratory distress.     Breath sounds: Normal breath sounds. No wheezing.  Abdominal:     General: Bowel sounds are normal. There is no distension or abdominal bruit.     Palpations: Abdomen is soft. There is no hepatomegaly or splenomegaly.     Tenderness: There is no abdominal tenderness. There is no right CVA tenderness or left CVA tenderness.     Hernia: No hernia is present.  Musculoskeletal:        General: Normal range of motion.     Cervical back: Normal range of motion and neck supple.     Right lower leg: No edema.     Left lower leg: No edema.  Lymphadenopathy:     Cervical: No cervical adenopathy.  Skin:    General: Skin is warm and dry.     Capillary Refill: Capillary refill takes less than 2 seconds.     Coloration: Skin is not cyanotic, jaundiced or pale.     Findings: No rash.  Neurological:     General: No focal deficit present.     Mental Status: He is alert and oriented to person, place, and time.     Sensory: Sensation is intact.     Motor: Motor function is intact.     Coordination: Coordination is intact.     Gait: Gait is intact.     Deep Tendon Reflexes: Reflexes are normal and symmetric.  Psychiatric:        Attention and Perception: Attention and perception normal.        Mood and Affect: Mood and affect normal.        Speech: Speech normal.        Behavior: Behavior normal. Behavior is cooperative.         Thought Content: Thought content normal.        Cognition and Memory: Cognition and memory normal.        Judgment: Judgment normal.     Results for orders placed or performed in visit on 06/30/23  Bayer DCA Hb A1c Waived   Collection Time: 06/30/23  3:02 PM  Result Value Ref Range   HB A1C (BAYER DCA - WAIVED) 5.2 4.8 - 5.6 %  CMP14+EGFR   Collection Time: 06/30/23  3:04 PM  Result Value Ref Range   Glucose 80 70 - 99 mg/dL   BUN 14 6 - 24 mg/dL   Creatinine, Ser 8.99 0.76 - 1.27 mg/dL   eGFR 87 >40 fO/fpw/8.26   BUN/Creatinine Ratio 14 9 - 20   Sodium 140 134 - 144 mmol/L   Potassium 4.4 3.5 - 5.2 mmol/L   Chloride 101 96 - 106 mmol/L   CO2 25 20 - 29 mmol/L   Calcium 9.1 8.7 - 10.2 mg/dL   Total Protein 6.5 6.0 - 8.5 g/dL   Albumin 4.2 3.8 - 4.9 g/dL   Globulin, Total 2.3 1.5 - 4.5 g/dL   Bilirubin Total 0.3 0.0 - 1.2 mg/dL   Alkaline Phosphatase 107 44 - 121 IU/L   AST 19 0 - 40 IU/L   ALT 30 0 - 44 IU/L  CBC with Differential/Platelet   Collection Time: 06/30/23  3:04 PM  Result Value Ref Range   WBC 6.5 3.4 - 10.8 x10E3/uL   RBC 4.95 4.14 - 5.80 x10E6/uL   Hemoglobin 14.6 13.0 - 17.7 g/dL   Hematocrit 55.5 62.4 - 51.0 %  MCV 90 79 - 97 fL   MCH 29.5 26.6 - 33.0 pg   MCHC 32.9 31.5 - 35.7 g/dL   RDW 87.4 88.3 - 84.5 %   Platelets 196 150 - 450 x10E3/uL   Neutrophils 67 Not Estab. %   Lymphs 20 Not Estab. %   Monocytes 8 Not Estab. %   Eos 3 Not Estab. %   Basos 1 Not Estab. %   Neutrophils Absolute 4.4 1.4 - 7.0 x10E3/uL   Lymphocytes Absolute 1.3 0.7 - 3.1 x10E3/uL   Monocytes Absolute 0.5 0.1 - 0.9 x10E3/uL   EOS (ABSOLUTE) 0.2 0.0 - 0.4 x10E3/uL   Basophils Absolute 0.0 0.0 - 0.2 x10E3/uL   Immature Granulocytes 1 Not Estab. %   Immature Grans (Abs) 0.0 0.0 - 0.1 x10E3/uL  Thyroid  Panel With TSH   Collection Time: 06/30/23  3:04 PM  Result Value Ref Range   TSH 1.030 0.450 - 4.500 uIU/mL   T4, Total 7.8 4.5 - 12.0 ug/dL   T3 Uptake Ratio 21 (L) 24  - 39 %   Free Thyroxine Index 1.6 1.2 - 4.9  Lipid panel   Collection Time: 06/30/23  3:04 PM  Result Value Ref Range   Cholesterol, Total 146 100 - 199 mg/dL   Triglycerides 699 (H) 0 - 149 mg/dL   HDL 30 (L) >60 mg/dL   VLDL Cholesterol Cal 48 (H) 5 - 40 mg/dL   LDL Chol Calc (NIH) 68 0 - 99 mg/dL   Chol/HDL Ratio 4.9 0.0 - 5.0 ratio  VITAMIN D  25 Hydroxy (Vit-D Deficiency, Fractures)   Collection Time: 06/30/23  3:04 PM  Result Value Ref Range   Vit D, 25-Hydroxy 20.2 (L) 30.0 - 100.0 ng/mL  Vitamin B12   Collection Time: 06/30/23  3:04 PM  Result Value Ref Range   Vitamin B-12 494 232 - 1,245 pg/mL     EKG: SR 83, PR 150 ms, QT 362 ms, no acute ST-T changes, no ectopy. Rosaline Bruns, FNP-C  Pertinent labs & imaging results that were available during my care of the patient were reviewed by me and considered in my medical decision making.  Assessment & Plan:  Tarris was seen today for medical management of chronic issues.  Diagnoses and all orders for this visit:  Primary hypertension -     CBC with Differential/Platelet -     CMP14+EGFR -     Thyroid  Panel With TSH -     EKG 12-Lead  Gastroesophageal reflux disease without esophagitis -     CBC with Differential/Platelet  Chronic idiopathic constipation -     linaclotide  (LINZESS ) 145 MCG CAPS capsule; Take 1 capsule (145 mcg total) by mouth daily before breakfast.  Obesity (BMI 30-39.9) -     CBC with Differential/Platelet -     CMP14+EGFR -     Thyroid  Panel With TSH -     Lipid panel -     Bayer DCA Hb A1c Waived -     Phentermine -Topiramate  ER 3.75-23 MG CP24; Take 1 capsule by mouth daily. -     Phentermine -Topiramate  ER 7.5-46 MG CP24; Take 1 capsule by mouth daily. -     Phentermine -Topiramate  ER 11.25-69 MG CP24; Take 1 capsule by mouth daily.        Obesity He has been unable to obtain insurance coverage for Wegovy  and Zepbound  for weight management. Despite regular exercise and healthy eating, he  has not achieved significant weight loss. Qsymia, a combination of phentermine  and  topiramate , was discussed as an alternative. It helps curb appetite and increase metabolism. The treatment is intended for short-term use (12 weeks) and requires a minimum of 5% body weight loss to continue. Phentermine  is a stimulant and can increase blood pressure, necessitating monitoring. If cravings persist after discontinuation, topiramate  can be continued alone. - Prescribe Qsymia with a taper pack starting at 3.75/23 mg for one week, then 7.5/46 mg for one week, and finally 11.25/69 mg until follow-up. - Perform EKG to assess for contraindications such as arrhythmias. - Monitor blood pressure regularly due to potential increase from phentermine . - Order lab work to ensure no contraindications for Qsymia. - Schedule follow-up in 4 weeks to assess progress and adjust treatment as necessary. - Contact if insurance does not cover Qsymia.  Hypertension Blood pressure readings at home are approximately 130/90 mmHg, with today's reading at 131/87 mmHg. There is concern about potential increases in blood pressure due to phentermine  use, which is a stimulant. - Monitor blood pressure regularly, especially during Qsymia treatment. - Perform EKG to check for any cardiac contraindications.  Constipation He reports infrequent bowel movements, approximately one to two times per week, despite adequate fiber and water intake. He is currently on Linzess  72 mcg, which is not providing sufficient relief. The dosage will be increased to improve bowel regularity. - Increase Linzess  dosage to 145 mcg. - Advise taking two 72 mcg tablets until the new prescription is available. - Monitor bowel movement frequency and report if symptoms persist.  Gastroesophageal Reflux Disease (GERD) GERD is well controlled with Protonix . He reports no changes in voice, cough, or other symptoms indicating effective management. - Continue current  Protonix  regimen.          Continue all other maintenance medications.  Follow up plan: Return in about 4 weeks (around 11/04/2023) for BMI.   Continue healthy lifestyle choices, including diet (rich in fruits, vegetables, and lean proteins, and low in salt and simple carbohydrates) and exercise (at least 30 minutes of moderate physical activity daily).  Educational handout given for Qsymia   The above assessment and management plan was discussed with the patient. The patient verbalized understanding of and has agreed to the management plan. Patient is aware to call the clinic if they develop any new symptoms or if symptoms persist or worsen. Patient is aware when to return to the clinic for a follow-up visit. Patient educated on when it is appropriate to go to the emergency department.   Rosaline Bruns, FNP-C [facility] [phone]

## 2023-10-08 LAB — THYROID PANEL WITH TSH
Free Thyroxine Index: 1.8 (ref 1.2–4.9)
T3 Uptake Ratio: 25 % (ref 24–39)
T4, Total: 7.1 ug/dL (ref 4.5–12.0)
TSH: 0.873 u[IU]/mL (ref 0.450–4.500)

## 2023-10-08 LAB — CBC WITH DIFFERENTIAL/PLATELET
Basophils Absolute: 0.1 x10E3/uL (ref 0.0–0.2)
Basos: 1 %
EOS (ABSOLUTE): 0.1 x10E3/uL (ref 0.0–0.4)
Eos: 2 %
Hematocrit: 44 % (ref 37.5–51.0)
Hemoglobin: 14.5 g/dL (ref 13.0–17.7)
Immature Grans (Abs): 0 x10E3/uL (ref 0.0–0.1)
Immature Granulocytes: 0 %
Lymphocytes Absolute: 1.4 x10E3/uL (ref 0.7–3.1)
Lymphs: 23 %
MCH: 29.4 pg (ref 26.6–33.0)
MCHC: 33 g/dL (ref 31.5–35.7)
MCV: 89 fL (ref 79–97)
Monocytes Absolute: 0.5 x10E3/uL (ref 0.1–0.9)
Monocytes: 8 %
Neutrophils Absolute: 3.8 x10E3/uL (ref 1.4–7.0)
Neutrophils: 65 %
Platelets: 193 x10E3/uL (ref 150–450)
RBC: 4.93 x10E6/uL (ref 4.14–5.80)
RDW: 12.6 % (ref 11.6–15.4)
WBC: 5.8 x10E3/uL (ref 3.4–10.8)

## 2023-10-08 LAB — LIPID PANEL
Chol/HDL Ratio: 5.2 ratio — ABNORMAL HIGH (ref 0.0–5.0)
Cholesterol, Total: 156 mg/dL (ref 100–199)
HDL: 30 mg/dL — ABNORMAL LOW (ref >39)
LDL Chol Calc (NIH): 75 mg/dL (ref 0–99)
Triglycerides: 312 mg/dL — ABNORMAL HIGH (ref 0–149)
VLDL Cholesterol Cal: 51 mg/dL — ABNORMAL HIGH (ref 5–40)

## 2023-10-08 LAB — CMP14+EGFR
ALT: 43 IU/L (ref 0–44)
AST: 28 IU/L (ref 0–40)
Albumin: 4.2 g/dL (ref 3.8–4.9)
Alkaline Phosphatase: 110 IU/L (ref 44–121)
BUN/Creatinine Ratio: 9 (ref 9–20)
BUN: 10 mg/dL (ref 6–24)
Bilirubin Total: 0.3 mg/dL (ref 0.0–1.2)
CO2: 22 mmol/L (ref 20–29)
Calcium: 9.3 mg/dL (ref 8.7–10.2)
Chloride: 102 mmol/L (ref 96–106)
Creatinine, Ser: 1.07 mg/dL (ref 0.76–1.27)
Globulin, Total: 2.1 g/dL (ref 1.5–4.5)
Glucose: 87 mg/dL (ref 70–99)
Potassium: 4.4 mmol/L (ref 3.5–5.2)
Sodium: 139 mmol/L (ref 134–144)
Total Protein: 6.3 g/dL (ref 6.0–8.5)
eGFR: 80 mL/min/1.73 (ref >59)

## 2023-10-11 LAB — BAYER DCA HB A1C WAIVED: HB A1C (BAYER DCA - WAIVED): 5.9 % — ABNORMAL HIGH (ref 4.8–5.6)

## 2023-10-11 NOTE — Telephone Encounter (Signed)
 Pharmacy Patient Advocate Encounter  Received notification from [employer] that Prior Authorization for Phentermine -Topiramate  ER 3.75-23MG  er capsules has been APPROVED from 10/11/2023 to 02/11/2024   PA #/Case ID/Reference #: [id number]

## 2023-10-22 ENCOUNTER — Other Ambulatory Visit (HOSPITAL_COMMUNITY): Payer: Self-pay

## 2023-11-12 ENCOUNTER — Ambulatory Visit: Admitting: Family Medicine

## 2023-11-23 ENCOUNTER — Other Ambulatory Visit

## 2023-11-30 ENCOUNTER — Other Ambulatory Visit

## 2023-12-02 ENCOUNTER — Other Ambulatory Visit: Payer: Self-pay | Admitting: Family Medicine

## 2023-12-02 DIAGNOSIS — K219 Gastro-esophageal reflux disease without esophagitis: Secondary | ICD-10-CM

## 2024-02-26 ENCOUNTER — Other Ambulatory Visit: Payer: Self-pay | Admitting: Family Medicine

## 2024-02-26 DIAGNOSIS — K219 Gastro-esophageal reflux disease without esophagitis: Secondary | ICD-10-CM

## 2024-03-21 ENCOUNTER — Other Ambulatory Visit: Payer: Self-pay | Admitting: Family Medicine

## 2024-03-21 DIAGNOSIS — K219 Gastro-esophageal reflux disease without esophagitis: Secondary | ICD-10-CM

## 2024-03-21 NOTE — Telephone Encounter (Signed)
 Michelle pt NTBS 30-d given 02/28/24

## 2024-03-22 ENCOUNTER — Other Ambulatory Visit: Payer: Self-pay | Admitting: Family Medicine

## 2024-03-22 ENCOUNTER — Telehealth: Payer: Self-pay

## 2024-03-22 ENCOUNTER — Encounter: Payer: Self-pay | Admitting: Family Medicine

## 2024-03-22 DIAGNOSIS — I10 Essential (primary) hypertension: Secondary | ICD-10-CM

## 2024-03-22 DIAGNOSIS — K219 Gastro-esophageal reflux disease without esophagitis: Secondary | ICD-10-CM

## 2024-03-22 MED ORDER — PANTOPRAZOLE SODIUM 20 MG PO TBEC: 20.0000 mg | DELAYED_RELEASE_TABLET | Freq: Two times a day (BID) | ORAL | 0 refills | Status: DC

## 2024-03-22 NOTE — Telephone Encounter (Signed)
 Letter sent

## 2024-03-22 NOTE — Telephone Encounter (Signed)
 Rx sent- left detailed message on vm

## 2024-03-22 NOTE — Telephone Encounter (Signed)
 Spoke with patient and made him aware that he shouldn't need refills on his Losartan  Rx at this time. Only needs refill on Protonix . Patient voiced understanding.  Okay to send in 1 month supply of Protonix  to last patient until his appt with PCP on 04/28/2024?  Wants it sent to [facility] in [place].

## 2024-03-22 NOTE — Addendum Note (Signed)
 Addended by: OLENA HARLENE BROCKS on: 03/22/2024 02:46 PM   Modules accepted: Orders

## 2024-03-22 NOTE — Telephone Encounter (Signed)
 Copied from CRM #[id number]. Topic: Clinical - Prescription Issue >> Mar 22, 2024 10:31 AM Alfonso ORN wrote: Reason for CRM: patient request if can get enough medication refill for his blood pressure medication losartan  (COZAAR ) 50 MG tablet, until his appt the appts are in mid january for Austin Curtis patient has an upcoming appointment schedule for 04/29/23 at 2:50pm Patient need the following medication :  losartan  (COZAAR ) 50 MG tablet,pantoprazole  ( PROTONIX ) 20 MG tablet >> Mar 22, 2024 10:36 AM Alfonso ORN wrote: Please contact patient to let know if approved if provider will let patient get the medication refill in advance

## 2024-03-22 NOTE — Telephone Encounter (Unsigned)
 Copied from CRM #[id number]. Topic: Clinical - Medication Refill >> Mar 22, 2024 10:19 AM Alfonso ORN wrote: Medication:    losartan  (COZAAR ) 50 MG tablet,pantoprazole  (PROTONIX ) 20 MG tablet      Has the patient contacted their pharmacy? Yes (Agent: If no, request that the patient contact the pharmacy for the refill. If patient does not wish to contact the pharmacy document the reason why and proceed with request.) (Agent: If yes, when and what did the pharmacy advise?)  This is the patient's preferred pharmacy:  [id number] - [place], [place] - [address] [place] [address] Phone: [phone] Fax: [phone]  Is this the correct pharmacy for this prescription? Yes If no, delete pharmacy and type the correct one.   Has the prescription been filled recently? No  Is the patient out of the medication? Yes  Has the patient been seen for an appointment in the last year OR does the patient have an upcoming appointment? Yes  Can we respond through MyChart? Yes  Agent: Please be advised that Rx refills may take up to 3 business days. We ask that you follow-up with your pharmacy.   ----------------------------------------------------------------------- From previous Reason for Contact - Scheduling: Patient/patient representative is calling to schedule an appointment. Refer to attachments for appointment information.

## 2024-04-23 ENCOUNTER — Other Ambulatory Visit: Payer: Self-pay | Admitting: Family Medicine

## 2024-04-23 DIAGNOSIS — K219 Gastro-esophageal reflux disease without esophagitis: Secondary | ICD-10-CM

## 2024-04-28 ENCOUNTER — Encounter: Payer: Self-pay | Admitting: Family Medicine

## 2024-04-28 ENCOUNTER — Ambulatory Visit: Admitting: Family Medicine

## 2024-04-28 VITALS — BP 139/74 | HR 82 | Temp 97.7°F | Ht 73.0 in | Wt 275.4 lb

## 2024-04-28 DIAGNOSIS — I10 Essential (primary) hypertension: Secondary | ICD-10-CM

## 2024-04-28 DIAGNOSIS — E782 Mixed hyperlipidemia: Secondary | ICD-10-CM | POA: Diagnosis not present

## 2024-04-28 MED ORDER — WEGOVY 1.5 MG PO TABS: 1.5000 mg | ORAL_TABLET | Freq: Every day | ORAL | 0 refills | Status: AC

## 2024-04-28 MED ORDER — WEGOVY 4 MG PO TABS: 4.0000 mg | ORAL_TABLET | Freq: Every day | ORAL | 1 refills | Status: AC

## 2024-04-28 NOTE — Patient Instructions (Signed)
Tips for success with Wegovy (and by success, how not to be super sick on your stomach): Eat small meals AVOID heavy foods (fried/ high in carbs like bread, pasta, rice) AVOID carbonated beverages (soda/ beer, as these can increase bloating) DOUBLE your water intake (will help you avoid constipation/ dehydration)  ZOXWRU CAN cause: Nausea Abdominal pain Increased acid reflux (sometimes presents as "sour burps") Constipation OR Diarrhea Fatigue (especially when you first start it)

## 2024-04-28 NOTE — Progress Notes (Signed)
 "    Subjective:  Patient ID: Austin Curtis, male    DOB: 14-Apr-1965, 59 y.o.   MRN: [id number]  Patient Care Team: Severa Rock HERO, FNP as PCP - General (Family Medicine) Rosendo, Veva CROME, NP (Inactive) as Nurse Practitioner (Internal Medicine)   Chief Complaint:  Medical Management of Chronic Issues and Hypertension   HPI: Austin Curtis is a 59 y.o. male presenting on 04/28/2024 for Medical Management of Chronic Issues and Hypertension   Austin Curtis is a 59 year old male who presents for  chronic follow up and weight management consultation.  He previously attempted to start a weight loss medication, but the initial dose was unavailable, which was disheartening.  His blood pressure is stable, typically in the 130s range, and he monitors it periodically at home. No associated symptoms such as headaches, chest pain, leg swelling, or visual changes.  No history of medullary thyroid  cancer, MEN2 syndrome, or pancreatitis. There is no family history of these conditions either.  No issues with bowel or bladder habits, and no changes in vision or hearing. No nocturia, frequent urination, weak stream, or scrotal or rectal pressure. No history of prostate cancer.  He drinks a lot of water and coffee in the morning, but does not consume soda.       Relevant past medical, surgical, family, and social history reviewed and updated as indicated.  Allergies and medications reviewed and updated. Data reviewed: Chart in Epic.   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[employer]  Tobacco Use   Smoking status: Never   Smokeless tobacco: Never  Vaping Use   Vaping status: Never Used  Substance and Sexual Activity   Alcohol use: No    Alcohol/week: 0.0 standard drinks of alcohol   Drug use: No   Sexual activity: Not on file  Other Topics Concern   Not on file  Social History Narrative   Not on file   Social Drivers of Health   Tobacco Use: Low Risk (04/28/2024)   Patient History    Smoking Tobacco Use: Never    Smokeless Tobacco Use: Never    Passive Exposure: Not on file  Financial Resource Strain: Low Risk (06/30/2023)   Overall Financial Resource Strain (CARDIA)    Difficulty of Paying Living Expenses: Not very hard  Food Insecurity: No Food Insecurity (06/30/2023)   Hunger Vital Sign    Worried About Running Out of Food in the Last Year: Never true    Ran Out of Food in the Last Year: Never true  Transportation Needs: No Transportation Needs (06/30/2023)   PRAPARE - Administrator, Civil Service (Medical): No    Lack of Transportation (Non-Medical): No  Physical Activity: Insufficiently Active (06/30/2023)   Exercise Vital Sign    Days of Exercise per Week: 3 days    Minutes of Exercise per Session: 30 min  Stress: No Stress Concern Present (06/30/2023)  [employer] of Occupational Health - Occupational Stress Questionnaire    Feeling of Stress : Only a little  Social Connections: Moderately Integrated (06/30/2023)   Social Connection and Isolation Panel    Frequency of Communication with Friends and Family: Three times a week    Frequency of Social Gatherings with Friends and Family: Twice a week    Attends Religious Services: More than 4 times per year    Active Member of Clubs or Organizations: No    Attends Engineer, Structural: Not on file    Marital Status: Married  Catering Manager Violence: Not on file  Depression (PHQ2-9): Low Risk (04/28/2024)   Depression  (PHQ2-9)    PHQ-2 Score: 0  Alcohol Screen: Not on file  Housing: Low Risk (06/30/2023)   Housing Stability Vital Sign    Unable to Pay for Housing in the Last Year: No    Number of Times Moved in the Last Year: 0    Homeless in the Last Year: No  Utilities: Not on file  Health Literacy: Not on file    Outpatient Encounter Medications as of 04/28/2024  Medication Sig   linaclotide  (LINZESS ) 145 MCG CAPS capsule Take 1 capsule (145 mcg total) by mouth daily before breakfast.   losartan  (COZAAR ) 50 MG tablet Take 1 tablet (50 mg total) by mouth daily.   pantoprazole  (PROTONIX ) 20 MG tablet Take 1 tablet (20 mg total) by mouth 2 (two) times daily before a meal.   semaglutide -weight management (WEGOVY ) 1.5 MG tablet Take 1 tablet (1.5 mg total) by mouth daily. Daily in AM on an empty stomach with 4 oz of water. Do not eat or drink for 30 minutes after dose.   semaglutide -weight management (WEGOVY ) 4 MG tablet Take 1 tablet (4 mg total) by mouth daily. Daily in morning on an empty stomach with 4 oz of water. Do not eat or drink for 30 minutes after dose.   [DISCONTINUED] Phentermine -Topiramate  ER 11.25-69 MG CP24 Take 1 capsule by mouth daily.   [DISCONTINUED] Phentermine -Topiramate  ER 3.75-23 MG CP24 Take 1 capsule by mouth daily.   [DISCONTINUED] Phentermine -Topiramate  ER 7.5-46 MG CP24 Take 1 capsule by mouth daily.   No facility-administered encounter medications on file as of 04/28/2024.    Allergies[1]  Pertinent ROS per HPI, otherwise unremarkable      Objective:  BP 139/74   Pulse 82   Temp 97.7 F (36.5 C) (Temporal)   Ht 6' 1 (1.854 m)   Wt 275 lb 6.4 oz (124.9 kg)   SpO2 94%   BMI 36.33 kg/m    Wt Readings from Last 3 Encounters:  04/28/24 275 lb 6.4 oz (124.9 kg)  10/07/23 273 lb 9.6 oz (124.1 kg)  06/30/23 277 lb 12.8 oz (126 kg)    Physical Exam Vitals and nursing note reviewed.  Constitutional:      General: He is not in acute distress.    Appearance:  Normal appearance. He is not ill-appearing, toxic-appearing or diaphoretic.  HENT:     Head: Normocephalic and atraumatic.     Nose: Nose normal.     Mouth/Throat:     Mouth: Mucous membranes are moist.  Eyes:     Pupils: Pupils are equal, round, and reactive to light.  Cardiovascular:     Rate and Rhythm: Normal rate and regular rhythm.     Heart sounds: Normal heart sounds.  Pulmonary:     Effort: Pulmonary effort is normal.     Breath sounds: Normal  breath sounds.  Musculoskeletal:     Cervical back: Neck supple.     Right lower leg: No edema.     Left lower leg: No edema.  Skin:    General: Skin is warm and dry.     Capillary Refill: Capillary refill takes less than 2 seconds.  Neurological:     General: No focal deficit present.     Mental Status: He is alert and oriented to person, place, and time.  Psychiatric:        Mood and Affect: Mood normal.        Behavior: Behavior normal.        Thought Content: Thought content normal.        Judgment: Judgment normal.       Results for orders placed or performed in visit on 10/07/23  Bayer DCA Hb A1c Waived   Collection Time: 10/07/23  3:21 PM  Result Value Ref Range   HB A1C (BAYER DCA - WAIVED) 5.9 (H) 4.8 - 5.6 %  CBC with Differential/Platelet   Collection Time: 10/07/23  3:23 PM  Result Value Ref Range   WBC 5.8 3.4 - 10.8 x10E3/uL   RBC 4.93 4.14 - 5.80 x10E6/uL   Hemoglobin 14.5 13.0 - 17.7 g/dL   Hematocrit 55.9 62.4 - 51.0 %   MCV 89 79 - 97 fL   MCH 29.4 26.6 - 33.0 pg   MCHC 33.0 31.5 - 35.7 g/dL   RDW 87.3 88.3 - 84.5 %   Platelets 193 150 - 450 x10E3/uL   Neutrophils 65 Not Estab. %   Lymphs 23 Not Estab. %   Monocytes 8 Not Estab. %   Eos 2 Not Estab. %   Basos 1 Not Estab. %   Neutrophils Absolute 3.8 1.4 - 7.0 x10E3/uL   Lymphocytes Absolute 1.4 0.7 - 3.1 x10E3/uL   Monocytes Absolute 0.5 0.1 - 0.9 x10E3/uL   EOS (ABSOLUTE) 0.1 0.0 - 0.4 x10E3/uL   Basophils Absolute 0.1 0.0 - 0.2 x10E3/uL    Immature Granulocytes 0 Not Estab. %   Immature Grans (Abs) 0.0 0.0 - 0.1 x10E3/uL  CMP14+EGFR   Collection Time: 10/07/23  3:23 PM  Result Value Ref Range   Glucose 87 70 - 99 mg/dL   BUN 10 6 - 24 mg/dL   Creatinine, Ser 8.92 0.76 - 1.27 mg/dL   eGFR 80 >40 fO/fpw/8.26   BUN/Creatinine Ratio 9 9 - 20   Sodium 139 134 - 144 mmol/L   Potassium 4.4 3.5 - 5.2 mmol/L   Chloride 102 96 - 106 mmol/L   CO2 22 20 - 29 mmol/L   Calcium 9.3 8.7 - 10.2 mg/dL   Total Protein 6.3 6.0 - 8.5 g/dL   Albumin 4.2 3.8 - 4.9 g/dL   Globulin, Total 2.1 1.5 - 4.5 g/dL   Bilirubin Total 0.3 0.0 - 1.2 mg/dL   Alkaline Phosphatase 110 44 - 121 IU/L   AST 28 0 - 40 IU/L   ALT 43 0 - 44 IU/L  Thyroid  Panel With TSH   Collection Time: 10/07/23  3:23 PM  Result Value Ref Range   TSH 0.873 0.450 - 4.500 uIU/mL   T4, Total 7.1 4.5 - 12.0 ug/dL   T3 Uptake Ratio 25 24 - 39 %   Free Thyroxine Index 1.8 1.2 - 4.9  Lipid panel   Collection Time: 10/07/23  3:23 PM  Result Value Ref Range   Cholesterol, Total 156 100 - 199 mg/dL   Triglycerides  312 (H) 0 - 149 mg/dL   HDL 30 (L) >60 mg/dL   VLDL Cholesterol Cal 51 (H) 5 - 40 mg/dL   LDL Chol Calc (NIH) 75 0 - 99 mg/dL   Chol/HDL Ratio 5.2 (H) 0.0 - 5.0 ratio       Pertinent labs & imaging results that were available during my care of the patient were reviewed by me and considered in my medical decision making.  Assessment & Plan:  Austin Curtis was seen today for medical management of chronic issues and hypertension.  Diagnoses and all orders for this visit:  Primary hypertension -     CBC with Differential/Platelet -     CMP14+EGFR -     Lipid panel -     TSH -     T4, Free -     semaglutide -weight management (WEGOVY ) 1.5 MG tablet; Take 1 tablet (1.5 mg total) by mouth daily. Daily in AM on an empty stomach with 4 oz of water. Do not eat or drink for 30 minutes after dose. -     semaglutide -weight management (WEGOVY ) 4 MG tablet; Take 1 tablet (4 mg  total) by mouth daily. Daily in morning on an empty stomach with 4 oz of water. Do not eat or drink for 30 minutes after dose.  Mixed hyperlipidemia -     CBC with Differential/Platelet -     CMP14+EGFR -     Lipid panel -     TSH -     T4, Free -     semaglutide -weight management (WEGOVY ) 1.5 MG tablet; Take 1 tablet (1.5 mg total) by mouth daily. Daily in AM on an empty stomach with 4 oz of water. Do not eat or drink for 30 minutes after dose. -     semaglutide -weight management (WEGOVY ) 4 MG tablet; Take 1 tablet (4 mg total) by mouth daily. Daily in morning on an empty stomach with 4 oz of water. Do not eat or drink for 30 minutes after dose.  Morbid obesity (HCC) -     CBC with Differential/Platelet -     CMP14+EGFR -     Lipid panel -     TSH -     T4, Free -     semaglutide -weight management (WEGOVY ) 1.5 MG tablet; Take 1 tablet (1.5 mg total) by mouth daily. Daily in AM on an empty stomach with 4 oz of water. Do not eat or drink for 30 minutes after dose. -     semaglutide -weight management (WEGOVY ) 4 MG tablet; Take 1 tablet (4 mg total) by mouth daily. Daily in morning on an empty stomach with 4 oz of water. Do not eat or drink for 30 minutes after dose.       Obesity Management with GLP-1 receptor agonist pills. No history of medullary thyroid  cancer, MEN2 syndrome, or pancreatitis. No issues with bowel or bladder habits. No changes in vision or hearing. No prostate problems. Discussed cost-effective options for weight management medications, including pills and injectables. He prefers to start with pills due to cost considerations. - Prescribed GLP-1 receptor agonist pills starting at 1.7 mg daily, to be taken on an empty stomach with 4 ounces of water every morning. - Sent prescription to [facility] for mail delivery. - Provided information on GLP-1 receptor agonist and tips for successful use. - Scheduled follow-up appointment in 8 weeks to assess progress and repeat lab  work.  Primary hypertension Blood pressure readings in the 130s. No associated symptoms such as  headaches, chest pain, leg swelling, or visual changes. Blood pressure is well-managed with current management. - Continue current blood pressure management.        Continue all other maintenance medications.  Follow up plan: Return in about 8 weeks (around 06/23/2024), or if symptoms worsen or fail to improve, for BMI.   Continue healthy lifestyle choices, including diet (rich in fruits, vegetables, and lean proteins, and low in salt and simple carbohydrates) and exercise (at least 30 minutes of moderate physical activity daily).  Educational handout given for Wegovy  and GLP-1 success  The above assessment and management plan was discussed with the patient. The patient verbalized understanding of and has agreed to the management plan. Patient is aware to call the clinic if they develop any new symptoms or if symptoms persist or worsen. Patient is aware when to return to the clinic for a follow-up visit. Patient educated on when it is appropriate to go to the emergency department.   Rosaline Bruns, FNP-C [facility] [phone]     [1]  Allergies Allergen Reactions   Penicillins Anaphylaxis   Lisinopril  Cough   "

## 2024-04-29 LAB — CMP14+EGFR
ALT: 34 [IU]/L (ref 0–44)
AST: 24 [IU]/L (ref 0–40)
Albumin: 4.3 g/dL (ref 3.8–4.9)
Alkaline Phosphatase: 98 [IU]/L (ref 47–123)
BUN/Creatinine Ratio: 12 (ref 10–24)
BUN: 13 mg/dL (ref 8–27)
Bilirubin Total: 0.2 mg/dL (ref 0.0–1.2)
CO2: 22 mmol/L (ref 20–29)
Calcium: 9.4 mg/dL (ref 8.6–10.2)
Chloride: 104 mmol/L (ref 96–106)
Creatinine, Ser: 1.13 mg/dL (ref 0.76–1.27)
Globulin, Total: 2.5 g/dL (ref 1.5–4.5)
Glucose: 84 mg/dL (ref 70–99)
Potassium: 4.5 mmol/L (ref 3.5–5.2)
Sodium: 141 mmol/L (ref 134–144)
Total Protein: 6.8 g/dL (ref 6.0–8.5)
eGFR: 74 mL/min/{1.73_m2}

## 2024-04-29 LAB — CBC WITH DIFFERENTIAL/PLATELET
Basophils Absolute: 0.1 10*3/uL (ref 0.0–0.2)
Basos: 1 %
EOS (ABSOLUTE): 0.2 10*3/uL (ref 0.0–0.4)
Eos: 2 %
Hematocrit: 44.9 % (ref 37.5–51.0)
Hemoglobin: 14.9 g/dL (ref 13.0–17.7)
Immature Grans (Abs): 0 10*3/uL (ref 0.0–0.1)
Immature Granulocytes: 0 %
Lymphocytes Absolute: 1.7 10*3/uL (ref 0.7–3.1)
Lymphs: 23 %
MCH: 29.6 pg (ref 26.6–33.0)
MCHC: 33.2 g/dL (ref 31.5–35.7)
MCV: 89 fL (ref 79–97)
Monocytes Absolute: 0.5 10*3/uL (ref 0.1–0.9)
Monocytes: 7 %
Neutrophils Absolute: 4.9 10*3/uL (ref 1.4–7.0)
Neutrophils: 67 %
Platelets: 214 10*3/uL (ref 150–450)
RBC: 5.04 x10E6/uL (ref 4.14–5.80)
RDW: 12.7 % (ref 11.6–15.4)
WBC: 7.4 10*3/uL (ref 3.4–10.8)

## 2024-04-29 LAB — T4, FREE: Free T4: 1.01 ng/dL (ref 0.82–1.77)

## 2024-04-29 LAB — TSH: TSH: 1.17 u[IU]/mL (ref 0.450–4.500)

## 2024-04-29 LAB — LIPID PANEL
Chol/HDL Ratio: 5.1 ratio — ABNORMAL HIGH (ref 0.0–5.0)
Cholesterol, Total: 184 mg/dL (ref 100–199)
HDL: 36 mg/dL — ABNORMAL LOW
LDL Chol Calc (NIH): 104 mg/dL — ABNORMAL HIGH (ref 0–99)
Triglycerides: 257 mg/dL — ABNORMAL HIGH (ref 0–149)
VLDL Cholesterol Cal: 44 mg/dL — ABNORMAL HIGH (ref 5–40)

## 2024-05-02 ENCOUNTER — Ambulatory Visit: Payer: Self-pay | Admitting: Family Medicine

## 2024-05-02 MED ORDER — ATORVASTATIN CALCIUM 20 MG PO TABS: 20.0000 mg | ORAL_TABLET | Freq: Every day | ORAL | 1 refills | Status: AC

## 2024-05-02 NOTE — Telephone Encounter (Signed)
 Patient aware and verbalizes understanding. Agree's to starting medication - medication sent.

## 2024-06-23 ENCOUNTER — Ambulatory Visit: Admitting: Family Medicine
